# Patient Record
Sex: Female | Born: 1945 | Race: White | Hispanic: No | Marital: Single | Smoking: Never smoker
Health system: Southern US, Community
[De-identification: ages and names within clinical notes are randomized; demographics above are authoritative.]

## PROBLEM LIST (undated history)

## (undated) DIAGNOSIS — M1990 Unspecified osteoarthritis, unspecified site: Secondary | ICD-10-CM

## (undated) DIAGNOSIS — Z853 Personal history of malignant neoplasm of breast: Secondary | ICD-10-CM

## (undated) DIAGNOSIS — Z803 Family history of malignant neoplasm of breast: Secondary | ICD-10-CM

## (undated) DIAGNOSIS — E785 Hyperlipidemia, unspecified: Secondary | ICD-10-CM

## (undated) DIAGNOSIS — K219 Gastro-esophageal reflux disease without esophagitis: Secondary | ICD-10-CM

## (undated) DIAGNOSIS — Z8639 Personal history of other endocrine, nutritional and metabolic disease: Secondary | ICD-10-CM

## (undated) DIAGNOSIS — Z1379 Encounter for other screening for genetic and chromosomal anomalies: Secondary | ICD-10-CM

## (undated) HISTORY — DX: Family history of malignant neoplasm of breast: Z80.3

## (undated) HISTORY — DX: Unspecified osteoarthritis, unspecified site: M19.90

## (undated) HISTORY — PX: BREAST SURGERY: SHX581

## (undated) HISTORY — DX: Encounter for other screening for genetic and chromosomal anomalies: Z13.79

## (undated) HISTORY — DX: Personal history of other endocrine, nutritional and metabolic disease: Z86.39

## (undated) HISTORY — DX: Personal history of malignant neoplasm of breast: Z85.3

## (undated) HISTORY — PX: EYE SURGERY: SHX253

## (undated) HISTORY — PX: TONSILLECTOMY: SUR1361

## (undated) HISTORY — DX: Hyperlipidemia, unspecified: E78.5

---

## 1975-01-13 HISTORY — PX: LIVER SURGERY: SHX698

## 1975-01-13 HISTORY — PX: ABDOMINAL EXPLORATION SURGERY: SHX538

## 2006-01-12 HISTORY — PX: MASTECTOMY: SHX3

## 2010-11-04 ENCOUNTER — Institutional Professional Consult (permissible substitution): Payer: Self-pay | Admitting: Internal Medicine

## 2010-11-10 ENCOUNTER — Institutional Professional Consult (permissible substitution): Payer: Self-pay | Admitting: Internal Medicine

## 2010-11-13 ENCOUNTER — Encounter: Payer: Self-pay | Admitting: *Deleted

## 2010-11-14 ENCOUNTER — Encounter: Payer: Self-pay | Admitting: Internal Medicine

## 2010-11-14 ENCOUNTER — Ambulatory Visit (INDEPENDENT_AMBULATORY_CARE_PROVIDER_SITE_OTHER)
Admission: RE | Admit: 2010-11-14 | Discharge: 2010-11-14 | Disposition: A | Discharge status: Home / Self Care | Payer: BC Managed Care – PPO | Source: Ambulatory Visit | Attending: Internal Medicine | Admitting: Internal Medicine

## 2010-11-14 ENCOUNTER — Telehealth: Payer: Self-pay | Admitting: Internal Medicine

## 2010-11-14 ENCOUNTER — Ambulatory Visit (INDEPENDENT_AMBULATORY_CARE_PROVIDER_SITE_OTHER): Payer: BC Managed Care – PPO | Admitting: Internal Medicine

## 2010-11-14 VITALS — BP 110/70 | HR 93 | Temp 97.4°F | Ht 60.0 in | Wt 167.6 lb

## 2010-11-14 DIAGNOSIS — R0609 Other forms of dyspnea: Secondary | ICD-10-CM

## 2010-11-14 DIAGNOSIS — R0989 Other specified symptoms and signs involving the circulatory and respiratory systems: Secondary | ICD-10-CM

## 2010-11-14 DIAGNOSIS — R0600 Dyspnea, unspecified: Secondary | ICD-10-CM | POA: Insufficient documentation

## 2010-11-14 MED ORDER — BUDESONIDE-FORMOTEROL FUMARATE 160-4.5 MCG/ACT IN AERO: INHALATION_SPRAY | RESPIRATORY_TRACT | Status: DC

## 2010-11-14 NOTE — Progress Notes (Signed)
  Subjective:    Patient ID: Kara [name], female    DOB: 31-Dec-1945, 65 y.o.   MRN: [id number]  HPI  45 yowf never smoker with ? Cat allergy manifested as trouble breathing starting as teenager referred by Dr Cyndia Bent for evaluation of sob 11/14/2010  11/14/2010  Dylan Monforte  Initial pulmonary office eval . Cc  Sob variably at rest x 2 years episodic lasts a couple of days, goes away for months ? Maybe advair helped maybe not because got better in Sept s [place]air [place], no assoc with activity which was not limited by sob even when she had it rest.   Sleeping ok without nocturnal  or early am exacerbation  of respiratory  c/o's or need for noct saba. Also denies any obvious fluctuation of symptoms with weather or environmental changes or other aggravating or alleviating factors except as outlined above        Review of Systems  Constitutional: Negative for fever and unexpected weight change.  HENT: Negative for ear pain, nosebleeds, congestion, sore throat, rhinorrhea, sneezing, trouble swallowing, dental problem, postnasal drip and sinus pressure.   Eyes: Negative for redness and itching.  Respiratory: Positive for shortness of breath. Negative for cough, chest tightness and wheezing.   Cardiovascular: Negative for palpitations and leg swelling.  Gastrointestinal: Negative for nausea and vomiting.  Genitourinary: Negative for dysuria.  Musculoskeletal: Negative for joint swelling.  Skin: Negative for rash.  Neurological: Negative for headaches.  Hematological: Does not bruise/bleed easily.  Psychiatric/Behavioral: Negative for dysphoric mood. The patient is not nervous/anxious.        Objective:   Physical Exam  Wt 167 11/14/2010   HEENT: nl dentition, turbinates, and orophanx. Nl external ear canals without cough reflex   NECK :  without JVD/Nodes/TM/ nl carotid upstrokes bilaterally   LUNGS: no acc muscle use, clear to A and P bilaterally without cough on  insp or exp maneuvers   CV:  RRR  no s3 or murmur or increase in P2, no edema   ABD:  soft and nontender with nl excursion in the supine position. No bruits or organomegaly, bowel sounds nl  MS:  warm without deformities, calf tenderness, cyanosis or clubbing  SKIN: warm and dry without lesions    NEURO:  alert, approp, no deficits   cxr 11/14/10 No active cardiopulmonary abnormalities.      Assessment & Plan:

## 2010-11-14 NOTE — Patient Instructions (Addendum)
GERD (REFLUX)  is an extremely common cause of respiratory symptoms, many times with no significant heartburn at all.    It can be treated with medication, but also with lifestyle changes including avoidance of late meals, excessive alcohol, smoking cessation, and avoid fatty foods, chocolate, peppermint, colas, red wine, and acidic juices such as orange juice.  NO MINT OR MENTHOL PRODUCTS SO NO COUGH DROPS  USE SUGARLESS CANDY INSTEAD (jolley ranchers or Stover's)  NO OIL BASED VITAMINS - use powdered substitutes.   If you have an attack try symicort 160 2 puffs every 12 hours - this should eliminite a spell within 15 minutes - please call us to let us know how you respond because if there's any doubt about the diagnosis is a methacholine challenge test.  Work on inhaler technique:  relax and gently blow all the way out then take a nice smooth deep breath back in, triggering the inhaler at same time you start breathing in.  Hold for up to 5 seconds if you can.  Rinse and gargle with water when done   If your mouth or throat starts to bother you,   I suggest you time the inhaler to your dental care and after using the inhaler(s) brush teeth and tongue with a baking soda containing toothpaste and when you rinse this out, gargle with it first to see if this helps your mouth and throat.     Please remember to go to the lab and x-ray department downstairs for your tests - we will call you with the results when then are available.

## 2010-11-15 NOTE — Assessment & Plan Note (Signed)
Symptoms are markedly disproportionate to objective findings and not clear this is a lung problem but pt does appear to have difficult airway management issues.   DDX of  difficult airways managment all start with A and  include Adherence, Ace Inhibitors, Acid Reflux, Active Sinus Disease, Alpha 1 Antitripsin deficiency, Anxiety masquerading as Airways dz,  ABPA,  allergy(esp in young), Aspiration (esp in elderly), Adverse effects of DPI,  Active smokers, plus two Bs  = Bronchiectasis and Beta blocker use..and one C= CHF  Adherence is always the initial "prime suspect" and is a multilayered concern that requires a "trust but verify" approach in every patient - starting with knowing how to use medications, especially inhalers, correctly, keeping up with refills and understanding the fundamental difference between maintenance and prns vs those medications only taken for a very short course and then stopped and not refilled. The proper method of use, as well as anticipated side effects, of this metered-dose inhaler are discussed and demonstrated to the patient. Improved only to 50% with coaching but worth empirically trying symicort with next flare and go on to MCT next if still having intermittent sob at rest with nl pft's  ? Acid reflux always a concern in this setting >  See instructions for specific recommendations which were reviewed directly with the patient who was given a copy with highlighter outlining the key components.

## 2010-11-17 NOTE — Telephone Encounter (Signed)
Pt stated she is returning MW's call & can be reached at [phone].  This note was created by MW on 11/2  Antionette Fairy

## 2010-11-17 NOTE — Telephone Encounter (Signed)
Pt notified that cxr showed no acute changes per Dr. Sherene Sires and verbalized understanding.

## 2011-09-15 ENCOUNTER — Emergency Department (HOSPITAL_COMMUNITY): Payer: BC Managed Care – PPO | Admitting: Anesthesiology

## 2011-09-15 ENCOUNTER — Inpatient Hospital Stay (HOSPITAL_COMMUNITY)
Admission: EM | Admit: 2011-09-15 | Discharge: 2011-09-21 | DRG: 165 | Disposition: A | Discharge status: Home / Self Care | Payer: BC Managed Care – PPO | Attending: General Surgery | Admitting: General Surgery

## 2011-09-15 ENCOUNTER — Encounter (HOSPITAL_COMMUNITY): Admission: EM | Disposition: A | Discharge status: Home / Self Care | Payer: Self-pay | Source: Home / Self Care

## 2011-09-15 ENCOUNTER — Emergency Department (HOSPITAL_COMMUNITY): Payer: BC Managed Care – PPO

## 2011-09-15 ENCOUNTER — Encounter (HOSPITAL_COMMUNITY): Payer: Self-pay | Admitting: Anesthesiology

## 2011-09-15 ENCOUNTER — Encounter (HOSPITAL_COMMUNITY): Payer: Self-pay

## 2011-09-15 DIAGNOSIS — E669 Obesity, unspecified: Secondary | ICD-10-CM | POA: Diagnosis present

## 2011-09-15 DIAGNOSIS — R109 Unspecified abdominal pain: Secondary | ICD-10-CM

## 2011-09-15 DIAGNOSIS — R112 Nausea with vomiting, unspecified: Secondary | ICD-10-CM

## 2011-09-15 DIAGNOSIS — R51 Headache: Secondary | ICD-10-CM | POA: Diagnosis present

## 2011-09-15 DIAGNOSIS — E785 Hyperlipidemia, unspecified: Secondary | ICD-10-CM | POA: Diagnosis present

## 2011-09-15 DIAGNOSIS — K3533 Acute appendicitis with perforation and localized peritonitis, with abscess: Principal | ICD-10-CM | POA: Diagnosis present

## 2011-09-15 DIAGNOSIS — K219 Gastro-esophageal reflux disease without esophagitis: Secondary | ICD-10-CM | POA: Diagnosis present

## 2011-09-15 DIAGNOSIS — Z853 Personal history of malignant neoplasm of breast: Secondary | ICD-10-CM | POA: Diagnosis present

## 2011-09-15 DIAGNOSIS — K3580 Unspecified acute appendicitis: Secondary | ICD-10-CM

## 2011-09-15 DIAGNOSIS — R1031 Right lower quadrant pain: Secondary | ICD-10-CM

## 2011-09-15 HISTORY — PX: APPENDECTOMY: SHX54

## 2011-09-15 HISTORY — DX: Gastro-esophageal reflux disease without esophagitis: K21.9

## 2011-09-15 HISTORY — DX: Hyperlipidemia, unspecified: E78.5

## 2011-09-15 LAB — COMPREHENSIVE METABOLIC PANEL
ALT: 24 U/L (ref 0–35)
AST: 24 U/L (ref 0–37)
Albumin: 3.3 g/dL — ABNORMAL LOW (ref 3.5–5.2)
Alkaline Phosphatase: 136 U/L — ABNORMAL HIGH (ref 39–117)
BUN: 15 mg/dL (ref 6–23)
CO2: 27 mEq/L (ref 19–32)
Calcium: 8.5 mg/dL (ref 8.4–10.5)
Chloride: 96 mEq/L (ref 96–112)
Creatinine, Ser: 0.64 mg/dL (ref 0.50–1.10)
GFR calc Af Amer: >90 mL/min (ref >90)
GFR calc non Af Amer: >90 mL/min (ref >90)
Glucose, Bld: 98 mg/dL (ref 70–99)
Potassium: 3.4 mEq/L — ABNORMAL LOW (ref 3.5–5.1)
Sodium: 136 mEq/L (ref 135–145)
Total Bilirubin: 0.5 mg/dL (ref 0.3–1.2)
Total Protein: 6.7 g/dL (ref 6.0–8.3)

## 2011-09-15 LAB — CBC
HCT: 41.4 % (ref 36.0–46.0)
Hemoglobin: 14.3 g/dL (ref 12.0–15.0)
MCH: 30 pg (ref 26.0–34.0)
MCHC: 34.5 g/dL (ref 30.0–36.0)
MCV: 87 fL (ref 78.0–100.0)
Platelets: 191 10*3/uL (ref 150–400)
RBC: 4.76 MIL/uL (ref 3.87–5.11)
RDW: 13 % (ref 11.5–15.5)
WBC: 16.2 10*3/uL — ABNORMAL HIGH (ref 4.0–10.5)

## 2011-09-15 LAB — LIPASE, BLOOD: Lipase: 58 U/L (ref 11–59)

## 2011-09-15 LAB — PROTIME-INR: INR: 1.17 (ref 0.00–1.49)

## 2011-09-15 SURGERY — APPENDECTOMY
Anesthesia: General | Site: Abdomen | Laterality: Right | Wound class: Dirty or Infected

## 2011-09-15 MED ORDER — ONDANSETRON HCL 4 MG/2ML IJ SOLN: 4.0000 mg | Freq: Four times a day (QID) | INTRAMUSCULAR | Status: DC | PRN

## 2011-09-15 MED ORDER — LIDOCAINE HCL (CARDIAC) 20 MG/ML IV SOLN
INTRAVENOUS | Status: DC | PRN
  Administered 2011-09-15: 100 mg via INTRAVENOUS

## 2011-09-15 MED ORDER — ONDANSETRON HCL 4 MG PO TABS: 4.0000 mg | ORAL_TABLET | Freq: Four times a day (QID) | ORAL | Status: DC | PRN

## 2011-09-15 MED ORDER — GLYCOPYRROLATE 0.2 MG/ML IJ SOLN
INTRAMUSCULAR | Status: DC | PRN
  Administered 2011-09-15: .6 mg via INTRAVENOUS

## 2011-09-15 MED ORDER — MORPHINE SULFATE 2 MG/ML IJ SOLN
2.0000 mg | INTRAMUSCULAR | Status: DC | PRN
  Administered 2011-09-15: 2 mg via INTRAVENOUS
  Administered 2011-09-16 (×3): 4 mg via INTRAVENOUS
  Administered 2011-09-16: 2 mg via INTRAVENOUS
  Administered 2011-09-16 – 2011-09-17 (×4): 4 mg via INTRAVENOUS
  Administered 2011-09-18 – 2011-09-19 (×9): 2 mg via INTRAVENOUS
  Filled 2011-09-15: qty 1
  Filled 2011-09-15 (×2): qty 2
  Filled 2011-09-15: qty 1
  Filled 2011-09-15 (×2): qty 2
  Filled 2011-09-15: qty 1
  Filled 2011-09-15: qty 2
  Filled 2011-09-15 (×3): qty 1
  Filled 2011-09-15: qty 2
  Filled 2011-09-15: qty 1
  Filled 2011-09-15 (×2): qty 2
  Filled 2011-09-15 (×3): qty 1

## 2011-09-15 MED ORDER — SUCCINYLCHOLINE CHLORIDE 20 MG/ML IJ SOLN
INTRAMUSCULAR | Status: DC | PRN
  Administered 2011-09-15: 100 mg via INTRAVENOUS

## 2011-09-15 MED ORDER — MIDAZOLAM HCL 5 MG/5ML IJ SOLN
INTRAMUSCULAR | Status: DC | PRN
  Administered 2011-09-15: 2 mg via INTRAVENOUS

## 2011-09-15 MED ORDER — ROCURONIUM BROMIDE 100 MG/10ML IV SOLN
INTRAVENOUS | Status: DC | PRN
  Administered 2011-09-15: 30 mg via INTRAVENOUS

## 2011-09-15 MED ORDER — ONDANSETRON HCL 4 MG/2ML IJ SOLN
INTRAMUSCULAR | Status: DC | PRN
  Administered 2011-09-15: 4 mg via INTRAVENOUS

## 2011-09-15 MED ORDER — OXYCODONE HCL 5 MG/5ML PO SOLN
5.0000 mg | Freq: Once | ORAL | Status: DC | PRN
  Filled 2011-09-15: qty 5

## 2011-09-15 MED ORDER — DEXAMETHASONE SODIUM PHOSPHATE 10 MG/ML IJ SOLN
INTRAMUSCULAR | Status: DC | PRN
  Administered 2011-09-15: 10 mg via INTRAVENOUS

## 2011-09-15 MED ORDER — FENTANYL CITRATE 0.05 MG/ML IJ SOLN
INTRAMUSCULAR | Status: DC | PRN
  Administered 2011-09-15: 100 ug via INTRAVENOUS
  Administered 2011-09-15: 50 ug via INTRAVENOUS
  Administered 2011-09-15: 100 ug via INTRAVENOUS

## 2011-09-15 MED ORDER — SODIUM CHLORIDE 0.9 % IV SOLN
1.0000 g | INTRAVENOUS | Status: DC
  Administered 2011-09-16 – 2011-09-20 (×5): 1 g via INTRAVENOUS
  Filled 2011-09-15 (×6): qty 1

## 2011-09-15 MED ORDER — LACTATED RINGERS IV SOLN
INTRAVENOUS | Status: DC | PRN
  Administered 2011-09-15 (×2): via INTRAVENOUS

## 2011-09-15 MED ORDER — ONDANSETRON HCL 4 MG/2ML IJ SOLN
4.0000 mg | Freq: Once | INTRAMUSCULAR | Status: DC
  Filled 2011-09-15: qty 2

## 2011-09-15 MED ORDER — SODIUM CHLORIDE 0.9 % IV SOLN
1.0000 g | Freq: Once | INTRAVENOUS | Status: AC
  Administered 2011-09-15: 1 g via INTRAVENOUS
  Filled 2011-09-15: qty 1

## 2011-09-15 MED ORDER — KCL IN DEXTROSE-NACL 20-5-0.9 MEQ/L-%-% IV SOLN
INTRAVENOUS | Status: DC
  Administered 2011-09-15 – 2011-09-18 (×7): via INTRAVENOUS
  Administered 2011-09-19: 50 mL/h via INTRAVENOUS
  Filled 2011-09-15 (×14): qty 1000

## 2011-09-15 MED ORDER — SODIUM CHLORIDE 0.9 % IJ SOLN
INTRAMUSCULAR | Status: DC | PRN
  Administered 2011-09-15: 20 mL

## 2011-09-15 MED ORDER — METOCLOPRAMIDE HCL 5 MG/ML IJ SOLN: 5.0000 mg | INTRAMUSCULAR | Status: DC | PRN

## 2011-09-15 MED ORDER — ACETAMINOPHEN 10 MG/ML IV SOLN
INTRAVENOUS | Status: AC
  Filled 2011-09-15: qty 100

## 2011-09-15 MED ORDER — HYDROMORPHONE HCL PF 1 MG/ML IJ SOLN
INTRAMUSCULAR | Status: DC | PRN
  Administered 2011-09-15 (×2): 1 mg via INTRAVENOUS

## 2011-09-15 MED ORDER — SODIUM CHLORIDE 0.9 % IV BOLUS (SEPSIS)
1000.0000 mL | Freq: Once | INTRAVENOUS | Status: AC
  Administered 2011-09-15: 1000 mL via INTRAVENOUS

## 2011-09-15 MED ORDER — NEOSTIGMINE METHYLSULFATE 1 MG/ML IJ SOLN
INTRAMUSCULAR | Status: DC | PRN
  Administered 2011-09-15: 5 mg via INTRAVENOUS

## 2011-09-15 MED ORDER — LACTATED RINGERS IV SOLN: INTRAVENOUS | Status: DC

## 2011-09-15 MED ORDER — BUPIVACAINE-EPINEPHRINE 0.25% -1:200000 IJ SOLN
INTRAMUSCULAR | Status: AC
  Filled 2011-09-15: qty 1

## 2011-09-15 MED ORDER — ACETAMINOPHEN 10 MG/ML IV SOLN
INTRAVENOUS | Status: DC | PRN
  Administered 2011-09-15: 1000 mg via INTRAVENOUS

## 2011-09-15 MED ORDER — METOCLOPRAMIDE HCL 5 MG/ML IJ SOLN
INTRAMUSCULAR | Status: AC
  Administered 2011-09-15: 5 mg
  Filled 2011-09-15: qty 2

## 2011-09-15 MED ORDER — PROMETHAZINE HCL 25 MG/ML IJ SOLN
INTRAMUSCULAR | Status: AC
  Filled 2011-09-15: qty 1

## 2011-09-15 MED ORDER — CITRIC ACID-SODIUM CITRATE 334-500 MG/5ML PO SOLN
ORAL | Status: AC
  Filled 2011-09-15: qty 30

## 2011-09-15 MED ORDER — OXYCODONE HCL 5 MG PO TABS: 5.0000 mg | ORAL_TABLET | Freq: Once | ORAL | Status: DC | PRN

## 2011-09-15 MED ORDER — CITRIC ACID-SODIUM CITRATE 334-500 MG/5ML PO SOLN
ORAL | Status: DC | PRN
  Administered 2011-09-15: 15 mL via ORAL

## 2011-09-15 MED ORDER — HEPARIN SODIUM (PORCINE) 5000 UNIT/ML IJ SOLN
5000.0000 [IU] | Freq: Three times a day (TID) | INTRAMUSCULAR | Status: DC
  Administered 2011-09-16 – 2011-09-21 (×16): 5000 [IU] via SUBCUTANEOUS
  Filled 2011-09-15 (×19): qty 1

## 2011-09-15 MED ORDER — HYDROMORPHONE HCL PF 1 MG/ML IJ SOLN: 0.2500 mg | INTRAMUSCULAR | Status: DC | PRN

## 2011-09-15 MED ORDER — IOHEXOL 300 MG/ML  SOLN
100.0000 mL | Freq: Once | INTRAMUSCULAR | Status: AC | PRN
  Administered 2011-09-15: 100 mL via INTRAVENOUS

## 2011-09-15 MED ORDER — 0.9 % SODIUM CHLORIDE (POUR BTL) OPTIME
TOPICAL | Status: DC | PRN
  Administered 2011-09-15: 2000 mL

## 2011-09-15 MED ORDER — MEPERIDINE HCL 50 MG/ML IJ SOLN: 6.2500 mg | INTRAMUSCULAR | Status: DC | PRN

## 2011-09-15 MED ORDER — BUPIVACAINE LIPOSOME 1.3 % IJ SUSP
20.0000 mL | Freq: Once | INTRAMUSCULAR | Status: DC
  Filled 2011-09-15: qty 20

## 2011-09-15 MED ORDER — BUPIVACAINE LIPOSOME 1.3 % IJ SUSP
INTRAMUSCULAR | Status: DC | PRN
  Administered 2011-09-15: 20 mL

## 2011-09-15 MED ORDER — PROPOFOL 10 MG/ML IV EMUL
INTRAVENOUS | Status: DC | PRN
  Administered 2011-09-15: 150 mg via INTRAVENOUS

## 2011-09-15 MED ORDER — PROMETHAZINE HCL 25 MG/ML IJ SOLN
6.2500 mg | INTRAMUSCULAR | Status: AC | PRN
  Administered 2011-09-15 (×2): 6.25 mg via INTRAVENOUS

## 2011-09-15 SURGICAL SUPPLY — 40 items
CLOTH BEACON ORANGE TIMEOUT ST (SAFETY) ×2 IMPLANT
DECANTER SPIKE VIAL GLASS SM (MISCELLANEOUS) ×2 IMPLANT
DRAIN CHANNEL 19F RND (DRAIN) ×2 IMPLANT
DRAPE LAPAROTOMY TRNSV 102X78 (DRAPE) IMPLANT
DRAPE UTILITY 15X26 (DRAPE) ×2 IMPLANT
ELECT REM PT RETURN 9FT ADLT (ELECTROSURGICAL) ×2
ELECTRODE REM PT RTRN 9FT ADLT (ELECTROSURGICAL) ×1 IMPLANT
EVACUATOR DRAINAGE 10X20 100CC (DRAIN) ×1 IMPLANT
EVACUATOR SILICONE 100CC (DRAIN) ×1
GLOVE BIOGEL PI IND STRL 7.0 (GLOVE) ×1 IMPLANT
GLOVE BIOGEL PI IND STRL 8.5 (GLOVE) ×2 IMPLANT
GLOVE BIOGEL PI INDICATOR 7.0 (GLOVE) ×1
GLOVE BIOGEL PI INDICATOR 8.5 (GLOVE) ×2
GLOVE SURG SS PI 6.5 STRL IVOR (GLOVE) ×2 IMPLANT
GOWN STRL NON-REIN LRG LVL3 (GOWN DISPOSABLE) IMPLANT
GOWN STRL REIN XL XLG (GOWN DISPOSABLE) ×6 IMPLANT
KIT BASIN OR (CUSTOM PROCEDURE TRAY) ×2 IMPLANT
NEEDLE HYPO 22GX1.5 SAFETY (NEEDLE) ×2 IMPLANT
NS IRRIG 1000ML POUR BTL (IV SOLUTION) ×4 IMPLANT
PACK GENERAL/GYN (CUSTOM PROCEDURE TRAY) ×2 IMPLANT
PEN SKIN MARKING BROAD (MISCELLANEOUS) IMPLANT
RELOAD PROXIMATE 30MM BLUE (ENDOMECHANICALS) ×4 IMPLANT
RELOAD STAPLER LINEAR PROX 30 (STAPLE) ×1 IMPLANT
SPONGE DRAIN TRACH 4X4 STRL 2S (GAUZE/BANDAGES/DRESSINGS) ×2 IMPLANT
SPONGE GAUZE 4X4 12PLY (GAUZE/BANDAGES/DRESSINGS) ×2 IMPLANT
SPONGE LAP 4X18 X RAY DECT (DISPOSABLE) ×2 IMPLANT
STAPLER RELOAD LINEAR PROX 30 (STAPLE) ×2
STAPLER VISISTAT 35W (STAPLE) IMPLANT
SUCTION POOLE TIP (SUCTIONS) ×2 IMPLANT
SUT PDS AB 1 CT1 27 (SUTURE) ×8 IMPLANT
SUT VIC AB 3-0 54XBRD REEL (SUTURE) ×1 IMPLANT
SUT VIC AB 3-0 BRD 54 (SUTURE) ×1
SUT VIC AB 3-0 SH 8-18 (SUTURE) ×2 IMPLANT
SYR 20CC LL (SYRINGE) ×2 IMPLANT
SYR CONTROL 10ML LL (SYRINGE) ×2 IMPLANT
TAPE CLOTH SURG 4X10 WHT LF (GAUZE/BANDAGES/DRESSINGS) ×2 IMPLANT
TOWEL OR 17X26 10 PK STRL BLUE (TOWEL DISPOSABLE) ×4 IMPLANT
TRAY FOLEY CATH 14FRSI W/METER (CATHETERS) ×2 IMPLANT
TUBE ANAEROBIC SPECIMEN COL (MISCELLANEOUS) ×4 IMPLANT
YANKAUER SUCT BULB TIP 10FT TU (MISCELLANEOUS) ×2 IMPLANT

## 2011-09-15 NOTE — ED Notes (Signed)
Surgeon at bedside.  

## 2011-09-15 NOTE — Progress Notes (Signed)
Patient ID: Kara [name], female   DOB: May 27, 1945, 66 y.o.   MRN: [id number]  CT scan has returned showing evidence of acute appendicitis with perforation and adjacent abscess.  I discussed this finding with the patient. Examination shows localized tenderness and some fullness in the right lower quadrant. I recommended proceeding with emergency appendectomy. Due to multiple previous laparotomies with a long midline incision and due to perforation with evidence of significant inflammation of the surrounding viscera I recommended open appendectomy. We discussed the indications for the procedure, alternatives of management with antibiotics, risks of bleeding, infection, anesthetic risks, and possible need for more extensive surgery such as ileocecectomy. With ongoing pain and elevated white count and tenderness I believe proceeding with surgery is the safest course. She understands and agrees and all her questions were answered.  Mariella Saa MD, FACS  09/15/2011, 7:04 PM

## 2011-09-15 NOTE — Anesthesia Preprocedure Evaluation (Addendum)
Anesthesia Evaluation  Patient identified by MRN, date of birth, ID band Patient awake    Reviewed: Allergy & Precautions, H&P , NPO status , Patient's Chart, lab work & pertinent test results  History of Anesthesia Complications (+) DIFFICULT AIRWAY  Airway Mallampati: II TM Distance: >3 FB Neck ROM: Full    Dental  (+) Teeth Intact and Caps   Pulmonary  breath sounds clear to auscultation  Pulmonary exam normal       Cardiovascular Exercise Tolerance: Good Rhythm:Regular Rate:Normal     Neuro/Psych  Headaches,    GI/Hepatic Neg liver ROS, GERD-  ,  Endo/Other  Hyperthyroidism   Renal/GU negative Renal ROS     Musculoskeletal  (+) Arthritis -, Osteoarthritis,    Abdominal (+) + obese,  Abdomen: tender.    Peds  Hematology negative hematology ROS (+)   Anesthesia Other Findings   Reproductive/Obstetrics                          Anesthesia Physical Anesthesia Plan  ASA: II and Emergent  Anesthesia Plan: General   Post-op Pain Management:    Induction: Intravenous, Rapid sequence and Cricoid pressure planned  Airway Management Planned: Oral ETT  Additional Equipment:   Intra-op Plan:   Post-operative Plan: Extubation in OR  Informed Consent: I have reviewed the patients History and Physical, chart, labs and discussed the procedure including the risks, benefits and alternatives for the proposed anesthesia with the patient or authorized representative who has indicated his/her understanding and acceptance.   Dental advisory given  Plan Discussed with: CRNA and Surgeon  Anesthesia Plan Comments:         Anesthesia Quick Evaluation

## 2011-09-15 NOTE — Anesthesia Postprocedure Evaluation (Signed)
Anesthesia Post Note  Patient: Kara [name]. [name]  Procedure(s) Performed: Procedure(s) (LRB): APPENDECTOMY (Right)  Anesthesia type: General  Patient location: PACU  Post pain: Pain level controlled  Post assessment: Post-op Vital signs reviewed  Last Vitals: BP 147/72  Pulse 82  Temp 37.1 C (Oral)  Resp 18  SpO2 100%  Post vital signs: Reviewed  Level of consciousness: sedated  Complications: No apparent anesthesia complications

## 2011-09-15 NOTE — ED Notes (Signed)
Consent signed for OR Appendectomy. Pt waiting to go to OR. No c/o at this time.

## 2011-09-15 NOTE — ED Notes (Signed)
Pt reports pain to RLQ of abdomen, more with movement. Reports N/V on Saturday.  Denies nausea or pain at this time.

## 2011-09-15 NOTE — Transfer of Care (Signed)
Immediate Anesthesia Transfer of Care Note  Patient: Kara [name]  Procedure(s) Performed: Procedure(s) (LRB) with comments: APPENDECTOMY (Right)  Patient Location: PACU  Anesthesia Type: General  Level of Consciousness: awake, alert  and oriented  Airway & Oxygen Therapy: Patient Spontanous Breathing and Patient connected to face mask oxygen  Post-op Assessment: Report given to PACU RN and Post -op Vital signs reviewed and stable  Post vital signs: Reviewed and stable  Complications: No apparent anesthesia complications

## 2011-09-15 NOTE — ED Notes (Signed)
MD at bedside.  Dr. Wilson at bedside 

## 2011-09-15 NOTE — ED Notes (Signed)
Patient transported to CT 

## 2011-09-15 NOTE — ED Notes (Signed)
States that she had been having a lot of cramping and pain for 4 days and went to the MD. States that she had a CT today and was told by Rosey Bath FNP at Dr Fortunato Curling office [facility] told her that a Careers adviser would meet her here in the ED. No pain or nausea at present.
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
## 2011-09-15 NOTE — Preoperative (Signed)
Beta Blockers   Reason not to administer Beta Blockers:Not Applicable 
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
## 2011-09-15 NOTE — Op Note (Signed)
Preoperative Diagnosis: Perforated appendicitis  Postoprative Diagnosis:Same  Procedure: Procedure(s): APPENDECTOMY, OPEN   Surgeon: Glenna Fellows T   Assistants: None  Anesthesia:  General endotracheal anesthesiaDiagnos  Indications:   Patient is a 66 year old female who presents with 3-4 days of persistent right lower quadrant abdominal pain. CT scan this evening has revealed evidence of perforated appendicitis. I recommended proceeding with open appendectomy. The procedure and risks were discussed with the patient detailed elsewhere.  Procedure Detail:  Patient is brought to the operating room, placed in the supine position on the operating table, and general endotracheal anesthesia induced. She had a history of difficult intubation but there were no apparent difficulties per anesthesia. Foley catheter was placed. She was already on broad-spectrum IV antibiotics. The abdomen was widely sterilely prepped and draped. The patient time out was performed and correct procedure verified. An oblique incision was made in the right lower quadrant near McBurney's point and dissection was carried down through the subcutaneous tissue with cautery. The external oblique was divided along its fibers and this was extended over onto the anterior rectus sheath a short distance. A small area of the lateral rectus muscle was divided with cautery. The internal oblique was bluntly split. The posterior sheath and peritoneum were elevated and sharply divided. There was foul-smelling frank purulence in the right lower quadrant that was cultured and suctioned and removed. Was an obvious inflammatory mass just inferior to the cecum. This was exposed and the appendix was seen to be gangrenous. Using blunt dissection it was mobilized away from inflammatory attachments to the right tube and posterior abdomen and elevated and freed back to the tip of the cecum. There was some moderate inflammation of the cecum but the  appendix itself at its base appeared just superficially inflamed. The mesoappendix was separated from the appendix at its space and in the appendix and the mesial appendix were each divided with a single firing of the TA 30 blue load stapler and the specimen was removed. The mesial appendix was further suture ligated with a figure-of-eight suture of 3-0 Vicryl. The appendiceal stump was oversewn with interrupted 3-0 Vicryl. Gloves and instruments were changed and the right lower quadrant was thoroughly irrigated with saline until clear. The lower pelvis was free of purulence. There were adhesions further superiorly along the right colon and the possible inflammatory process appeared localized in the right lower quadrant. After irrigation was clear that his return to their anatomic position. A 19 Blake drain was placed in the area of the appendiceal bed and brought out through a lateral stab wound. The layers of abdominal wall were infiltrated with Exparel local anesthetic. The wound was then closed in 3 layers with running #1 PDS. The subcutaneous tissue and skin were left packed open with moist saline gauze. Sponge needle and instrument counts were correct.  Findings: Gangrenous perforated appendicitis  Estimated Blood Loss:  Minimal         Drains: JACKSON-PRATT (JP)  Blood Given: none          Specimens: appendix        Complications:  * No complications entered in OR log *         Disposition: PACU - hemodynamically stable.         Condition: stable  Mariella Saa MD, FACS  09/15/2011, 9:26 PM

## 2011-09-15 NOTE — ED Notes (Signed)
Pt presents with no acute distress.  Pt c/o of nausea and abd pain since last week- Seen at PCP today CT scan showed inflammation- disc with pt- Past ate ENSURE at 11am

## 2011-09-15 NOTE — H&P (Signed)
History as below.  AFVSS Non toxic appearing. Smiling.  cta Reg Soft, nd, large midline incision. TTP RLQ - No RT/peritonitis.  Outside ct reviewed with Dr Lowella Dandy. Inflammatory process in RLQ. Appendix visualized wall looks ok. Fluid-filled air pocket near cecum/appendix/Rt ovary. No gross free air or fluid. 3cm lesion in liver - hypervascular. Ct somewhat with lack of iv/oral contrast.   Diffl: perforated appendicitis with abscess, cecal diveriticulitis with microperf; TOA  Labs pending  Since pt nontoxic appearing, I think repeating her CT with IV, rectal, oral contrast to try to better elucidate the etiology of the inflammatory process would be worthwhile. A tubovarian abscess would be best managed by GYN. If cecal diverticulitis with microperf could possibly be managed nonsurgically. Appendicitis would require surgery. Explained diffl to pt and sister. They are in agreement with plan. Explained that the CT could be non-diagnostic and might still end up in OR for exploration. Will ask Dr Johna Sheriff to follow-up on.  Mary Sella. Andrey Campanile, MD, FACS General, Bariatric, & Minimally Invasive Surgery [facility], [place]

## 2011-09-16 ENCOUNTER — Encounter (HOSPITAL_COMMUNITY): Payer: Self-pay | Admitting: General Surgery

## 2011-09-16 LAB — CBC
HCT: 40.6 % (ref 36.0–46.0)
MCV: 87.7 fL (ref 78.0–100.0)
Platelets: 154 10*3/uL (ref 150–400)
RBC: 4.63 MIL/uL (ref 3.87–5.11)
RDW: 13.3 % (ref 11.5–15.5)
WBC: 12.4 10*3/uL — ABNORMAL HIGH (ref 4.0–10.5)

## 2011-09-16 LAB — BASIC METABOLIC PANEL
CO2: 23 mEq/L (ref 19–32)
Chloride: 101 mEq/L (ref 96–112)
Creatinine, Ser: 0.49 mg/dL — ABNORMAL LOW (ref 0.50–1.10)
GFR calc Af Amer: >90 mL/min (ref >90)
Potassium: 3.9 mEq/L (ref 3.5–5.1)
Sodium: 135 mEq/L (ref 135–145)
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
## 2011-09-16 NOTE — Care Management Note (Signed)
    Page 1 of 1   09/18/2011     3:17:33 PM   CARE MANAGEMENT NOTE 09/18/2011  Patient:  Kara [name], Kara L.   Account Number:  [id number]  Date Initiated:  09/16/2011  Documentation initiated by:  Calel Pisarski  Subjective/Objective Assessment:   66 yo female admitted s/p open appendectomy. PTA lived at home with father,is his primary caregiver.     Action/Plan:   Anticipated DC Date:  09/19/2011   Anticipated DC Plan:  HOME/SELF CARE      DC Planning Services  CM consult      Choice offered to / List presented to:             Status of service:  Completed, signed off Medicare Important Message given?   (If response is "NO", the following Medicare IM given date fields will be blank) Date Medicare IM given:   Date Additional Medicare IM given:    Discharge Disposition:  HOME/SELF CARE  Per UR Regulation:  Reviewed for med. necessity/level of care/duration of stay  If discussed at Long Length of Stay Meetings, dates discussed:    Comments:  09-18-11 Lorenda Ishihara RN CM [phone] Spoke with patient at bedside. States she currently lives with her 25 yo father who was fairly independent up until recently. He has developed more physical limitations and increased dementia. He is currently being cared for by other family members and is on a wait list for ALF placement. Patient verbalizes understanding that she need recover and states she has good family support. Hopes to have her father placed sometime this month. No HH or DME needs anticipated at this time. Will continue to follow.

## 2011-09-16 NOTE — Progress Notes (Signed)
Some burping, no flatus Pain ok  Alert, nad Soft, mild distension, incision RLQ - dressing c/d/i  Ruptured appx s/p open appy  Start w-d dressing tomorrow Cont iv abx. Will need 12-14 days of abx Cont heparin Ice chips  Mary Sella. Andrey Campanile, MD, FACS General, Bariatric, & Minimally Invasive Surgery [facility], [place]

## 2011-09-17 MED ORDER — ACETAMINOPHEN 10 MG/ML IV SOLN
1000.0000 mg | Freq: Four times a day (QID) | INTRAVENOUS | Status: AC
  Administered 2011-09-17 – 2011-09-18 (×4): 1000 mg via INTRAVENOUS
  Filled 2011-09-17 (×5): qty 100

## 2011-09-17 NOTE — Progress Notes (Signed)
As below  Await return of bowel function. Hopefully start some sips of clears tomorrow. Cont abx for [employer]. Andrey Campanile, MD, FACS General, Bariatric, & Minimally Invasive Surgery [facility], [place]
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
## 2011-09-18 LAB — COMPREHENSIVE METABOLIC PANEL
ALT: 18 U/L (ref 0–35)
Alkaline Phosphatase: 78 U/L (ref 39–117)
BUN: 5 mg/dL — ABNORMAL LOW (ref 6–23)
CO2: 23 mEq/L (ref 19–32)
GFR calc Af Amer: >90 mL/min (ref >90)
GFR calc non Af Amer: >90 mL/min (ref >90)
Glucose, Bld: 103 mg/dL — ABNORMAL HIGH (ref 70–99)
Potassium: 3.6 mEq/L (ref 3.5–5.1)
Sodium: 136 mEq/L (ref 135–145)

## 2011-09-18 LAB — CBC
HCT: 33.5 % — ABNORMAL LOW (ref 36.0–46.0)
Hemoglobin: 11.1 g/dL — ABNORMAL LOW (ref 12.0–15.0)
MCH: 29.8 pg (ref 26.0–34.0)
RBC: 3.73 MIL/uL — ABNORMAL LOW (ref 3.87–5.11)

## 2011-09-18 LAB — CULTURE, ROUTINE-ABSCESS

## 2011-09-18 MED ORDER — BISACODYL 10 MG RE SUPP
10.0000 mg | Freq: Two times a day (BID) | RECTAL | Status: AC
  Administered 2011-09-18: 10 mg via RECTAL
  Filled 2011-09-18: qty 1

## 2011-09-18 NOTE — Progress Notes (Signed)
Advance directive information and paperwork placed on chart. Original advanced directive paperwork returned to patient and copy placed to chart.

## 2011-09-18 NOTE — Progress Notes (Signed)
3 Days Post-Op  Subjective:  Alert and stable. Afebrile. Ambulating in halls. Voiding uneventfully. No flatus or stool yet. No real nausea. Eating ice chips.  Pathology report shows acute suppurative appendicitis with rupture.  WBC down to 9100, hemoglobin 11.1, potassium 3.6, glucose 103.  Objective: Vital signs in last 24 hours: Temp:  [97.5 F (36.4 C)-98.8 F (37.1 C)] 98.8 F (37.1 C) (09/05 2137) Pulse Rate:  [80-94] 80  (09/05 2137) Resp:  [18] 18  (09/05 2137) BP: (113-129)/(68-72) 113/68 mmHg (09/05 2137) SpO2:  [95 %-97 %] 96 % (09/05 2137) Last BM Date: 09/15/11  Intake/Output from previous day: 09/05 0701 - 09/06 0700 In: 3235.4 [I.V.:3235.4] Out: 975 [Urine:950; Drains:25] Intake/Output this shift: Total I/O In: 1250 [I.V.:1250] Out: 505 [Urine:500; Drains:5]  General appearance: alert. No distress. Friendly Mental status normal. GI: abdomen soft. Occasional bowel sounds. Wound packed open and very clean. JP drainage thin, serous.  Lab Results:  Results for orders placed during the hospital encounter of 09/15/11 (from the past 24 hour(s))  CBC     Status: Abnormal   Collection Time   09/18/11  4:13 AM      Component Value Range   WBC 9.1  4.0 - 10.5 K/uL   RBC 3.73 (*) 3.87 - 5.11 MIL/uL   Hemoglobin 11.1 (*) 12.0 - 15.0 g/dL   HCT 16.1 (*) 09.6 - 04.5 %   MCV 89.8  78.0 - 100.0 fL   MCH 29.8  26.0 - 34.0 pg   MCHC 33.1  30.0 - 36.0 g/dL   RDW 40.9  81.1 - 91.4 %   Platelets 172  150 - 400 K/uL  COMPREHENSIVE METABOLIC PANEL     Status: Abnormal   Collection Time   09/18/11  4:13 AM      Component Value Range   Sodium 136  135 - 145 mEq/L   Potassium 3.6  3.5 - 5.1 mEq/L   Chloride 104  96 - 112 mEq/L   CO2 23  19 - 32 mEq/L   Glucose, Bld 103 (*) 70 - 99 mg/dL   BUN 5 (*) 6 - 23 mg/dL   Creatinine, Ser 7.82 (*) 0.50 - 1.10 mg/dL   Calcium 7.4 (*) 8.4 - 10.5 mg/dL   Total Protein 4.7 (*) 6.0 - 8.3 g/dL   Albumin 2.0 (*) 3.5 - 5.2 g/dL   AST 17  0  - 37 U/L   ALT 18  0 - 35 U/L   Alkaline Phosphatase 78  39 - 117 U/L   Total Bilirubin 0.3  0.3 - 1.2 mg/dL   GFR calc non Af Amer >90  >90 mL/min   GFR calc Af Amer >90  >90 mL/min     Studies/Results: @RISRSLT24 @     . acetaminophen  1,000 mg Intravenous Q6H  . ertapenem (INVANZ) IV  1 g Intravenous Q24H  . heparin  5,000 Units Subcutaneous Q8H     Assessment/Plan: s/p Procedure(s): APPENDECTOMY  POD #3. Stable. Ruptured appendicitis with localized peritonitis. Await return of bowel function. Allow clear liquids, as tol. Continue antibiotics dulcolax suppository. Pathology report discussed with patient.    LOS: 3 days    Erlean Mealor M. Derrell Lolling, M.D., [facility] General and Minimally invasive Surgery Breast and Colorectal Surgery Office:   [phone] Pager:   [phone]  09/18/2011  . .prob

## 2011-09-19 MED ORDER — HYDROCODONE-ACETAMINOPHEN 5-325 MG PO TABS
1.0000 | ORAL_TABLET | ORAL | Status: DC | PRN
  Administered 2011-09-19 – 2011-09-20 (×6): 1 via ORAL
  Filled 2011-09-19 (×6): qty 1

## 2011-09-19 NOTE — Progress Notes (Signed)
General Surgery Note  LOS: 4 days  POD# 4 Room - 1539  Assessment/Plan: 1.  APPENDECTOMY, open - B. [facility] - 09/15/2011  Drain removed.  Open wound.  Doing well.  To increase to full liq   2.  GERD (gastroesophageal reflux disease) (09/15/2011)    3.  Dyslipidemia  4.  DVT proph - SQ hep    Subjective:  Having loose stools.  Tolerating clear liquids.  Lives by self.  We talked a little about going home.  She has an older father she takes care of.  Objective:   Filed Vitals:   09/19/11 0541  BP: 135/56  Pulse: 85  Temp: 99.4 F (37.4 C)  Resp: 18     Intake/Output from previous day:  09/06 0701 - 09/07 0700 In: 2980 [P.O.:480; I.V.:2500] Out: 2880 [Urine:2850; Drains:30]  Intake/Output this shift:  Total I/O In: -  Out: 600 [Urine:600]   Physical Exam:   General: WN WF who is alert and oriented.    HEENT: Normal. Pupils equal. .   Lungs: Clear   Abdomen: Soft, few BS.   Wound: Open and clean.  I removed the JP drain.   Neurologic:  Grossly intact to motor and sensory function.   Psychiatric: Has normal mood and affect.   Lab Results:    [facility] 09/18/11 0413  WBC 9.1  HGB 11.1*  HCT 33.5*  PLT 172    BMET   Basename 09/18/11 0413  NA 136  K 3.6  CL 104  CO2 23  GLUCOSE 103*  BUN 5*  CREATININE 0.49*  CALCIUM 7.4*    PT/INR  No results found for this basename: LABPROT:2,INR:2 in the last 72 hours  ABG  No results found for this basename: PHART:2,PCO2:2,PO2:2,HCO3:2 in the last 72 hours   Studies/Results:  No results found.   Anti-infectives:   Anti-infectives     Start     Dose/Rate Route Frequency Ordered Stop   09/16/11 1500   ertapenem (INVANZ) 1 g in sodium chloride 0.9 % 50 mL IVPB        1 g 100 mL/hr over 30 Minutes Intravenous Every 24 hours 09/15/11 2323     09/15/11 1515   ertapenem (INVANZ) 1 g in sodium chloride 0.9 % 50 mL IVPB        1 g 100 mL/hr over 30 Minutes Intravenous  Once 09/15/11 1506 09/15/11 1603           Ovidio Kin, MD, FACS Pager: [phone],   [facility] Office: [phone] 09/19/2011

## 2011-09-19 NOTE — Progress Notes (Signed)
Pt alert x4 tol dressing change last night. Bid wet to dry dressing done around 23:30. Pre medicated with pain meds. Wet to dry with packing two 4x4 guaze used. No odor or drainage tol well will continue to monitor.

## 2011-09-20 LAB — ANAEROBIC CULTURE

## 2011-09-20 MED ORDER — LIDOCAINE HCL 1 % IJ SOLN
INTRAMUSCULAR | Status: AC
  Administered 2011-09-20: 10:00:00
  Filled 2011-09-20: qty 20

## 2011-09-20 NOTE — Progress Notes (Addendum)
General Surgery Note  LOS: 5 days  POD# 5 Room - 1539  Assessment/Plan: 1.  APPENDECTOMY, open - B. Hoxworth - 09/15/2011  Open wound, which looks good.  I did a DPC of the wound.  To increase to reg diet  On Invanz  Doing well.  2.  GERD (gastroesophageal reflux disease) (09/15/2011)    3.  Dyslipidemia  4.  DVT proph - SQ hep  Procedure note: At the bedside, I did a DPC of the RLQ wound.  The wound was painted with betadine.  Infiltrated with about 15 cc of 1% xylocaine.  Closed with 2-0 nylon sutures and sterilely dressed.    Subjective:  Had some loose stools.  Tolerating full liquids.  Lives by self.  We talked a little about going home.  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  That is her job.  Objective:   Filed Vitals:   09/20/11 0600  BP: 114/69  Pulse: 79  Temp: 98.4 F (36.9 C)  Resp: 16     Intake/Output from previous day:  09/07 0701 - 09/08 0700 In: 1961.3 [P.O.:360; I.V.:1601.3] Out: 3850 [Urine:3850]  Intake/Output this shift:      Physical Exam:   General: WN WF who is alert and oriented.    HEENT: Normal. Pupils equal.   Abdomen: Soft, few BS.   Wound: Open and clean.    Neurologic:  Grossly intact to motor and sensory function.   Psychiatric: Has normal mood and affect.   Lab Results:     [facility] 09/18/11 0413  WBC 9.1  HGB 11.1*  HCT 33.5*  PLT 172    BMET    Basename 09/18/11 0413  NA 136  K 3.6  CL 104  CO2 23  GLUCOSE 103*  BUN 5*  CREATININE 0.49*  CALCIUM 7.4*    PT/INR  No results found for this basename: LABPROT:2,INR:2 in the last 72 hours  ABG  No results found for this basename: PHART:2,PCO2:2,PO2:2,HCO3:2 in the last 72 hours   Studies/Results:  No results found.   Anti-infectives:   Anti-infectives     Start     Dose/Rate Route Frequency Ordered Stop   09/16/11 1500   ertapenem (INVANZ) 1 g in sodium chloride 0.9 % 50 mL IVPB        1 g 100 mL/hr over 30 Minutes Intravenous Every 24 hours  09/15/11 2323     09/15/11 1515   ertapenem (INVANZ) 1 g in sodium chloride 0.9 % 50 mL IVPB        1 g 100 mL/hr over 30 Minutes Intravenous  Once 09/15/11 1506 09/15/11 1603          Ovidio Kin, MD, FACS Pager: [phone],   [facility] Office: [phone] 09/20/2011

## 2011-09-21 MED ORDER — HYDROCODONE-ACETAMINOPHEN 5-325 MG PO TABS: 1.0000 | ORAL_TABLET | ORAL | Status: DC | PRN

## 2011-09-21 NOTE — Discharge Summary (Signed)
Patient ID: Kara [name] MRN: [id number] DOB/AGE: 64-15-1947 64 y.o.  Admit date: 09/15/2011 Discharge date: 09/21/2011  Procedures: open appendectomy  Consults: None  Reason for Admission: This is a 66 yo female who started having nausea and vomiting with abdominal pain.  She saw her PCP who sent her for a CT scan.  Eventually, this was felt to represent acute appendicitis.  She was admitted.  Admission Diagnoses:  1.  Right lower quadrant intra-abdominal abscess, possible colitis, possible ruptured appendix, possible tubo-ovarian abscess  Hospital Course: The patient was admitted.  Because the first CT was without contrast and a definite process could not be determined, a repeat scan with oral, IV, and rectal contrast was given.  This revealed acute perforated appendicitis with abscess.  She was taken to the operating room where she underwent an open appendectomy.  Her wound was left open.  Delayed primary closure of the wound was ultimately done prior to discharge.  She did have a post op ileus, but was able to start on clear liquids on POD# 3.  She tolerated these well and was able to have her diet advanced as tolerated.  She remained on Invanz throughout her hospitalization.  Her WBC was normal and this was discontinued at time of discharge.  The patient was felt stable for dc home on POD #6 with 4 more days of po abx therapy.  Discharge Diagnoses:  Active Problems:  GERD (gastroesophageal reflux disease)  Dyslipidemia acute perforated appendicitis, s/p open appendectomy  Discharge Medications: Medication List  As of 09/21/2011 11:57 AM   TAKE these medications         CALCIUM PO   Take 1 tablet by mouth daily.      cholecalciferol 1000 UNITS tablet   Commonly known as: VITAMIN D   Take 2,000 Units by mouth daily.      ezetimibe 10 MG tablet   Commonly known as: ZETIA   Take 10 mg by mouth daily.      HYDROcodone-acetaminophen 5-325 MG per tablet   Commonly known as:  NORCO/VICODIN   Take 1-2 tablets by mouth every 4 (four) hours as needed.      PRESCRIPTION MEDICATION   Take 1 tablet by mouth daily.      PreserVision/Lutein Caps   Take 1 capsule by mouth daily.              Augmentin 875mg                    Take 1 tablet by mouth BID for 4 days    Discharge Instructions: Follow-up Information    Follow up with [facility] on 09/29/2011. (arrive at 1:55 for a 2:15pm)          Signed: Deannie Resetar E 09/21/2011, 11:57 AM

## 2011-09-21 NOTE — Progress Notes (Signed)
Agree with above, home today

## 2011-09-21 NOTE — Progress Notes (Signed)
Patient ID: Kara [name], female   DOB: 05/07/1945, 66 y.o.   MRN: [id number] 6 Days Post-Op  Subjective: Pt feels well.  No c/o.  Tolerating a regular diet  Objective: Vital signs in last 24 hours: Temp:  [97.9 F (36.6 C)-98.8 F (37.1 C)] 98 F (36.7 C) (09/09 0515) Pulse Rate:  [81-96] 81  (09/09 0515) Resp:  [16-18] 18  (09/09 0515) BP: (118-130)/(42-75) 122/72 mmHg (09/09 0515) SpO2:  [93 %-95 %] 93 % (09/09 0515) Last BM Date: 09/18/11  Intake/Output from previous day: 09/08 0701 - 09/09 0700 In: 290 [P.O.:240; IV Piggyback:50] Out: 1500 [Urine:1500] Intake/Output this shift: Total I/O In: 240 [P.O.:240] Out: 75 [Urine:75]  PE: Abd: soft, +BS, ND, incision has been closed and looks good.  No erythema or sign of infection.  Lab Results:  No results found for this basename: WBC:2,HGB:2,HCT:2,PLT:2 in the last 72 hours BMET No results found for this basename: NA:2,K:2,CL:2,CO2:2,GLUCOSE:2,BUN:2,CREATININE:2,CALCIUM:2 in the last 72 hours PT/INR No results found for this basename: LABPROT:2,INR:2 in the last 72 hours CMP     Component Value Date/Time   NA 136 09/18/2011 0413   K 3.6 09/18/2011 0413   CL 104 09/18/2011 0413   CO2 23 09/18/2011 0413   GLUCOSE 103* 09/18/2011 0413   BUN 5* 09/18/2011 0413   CREATININE 0.49* 09/18/2011 0413   CALCIUM 7.4* 09/18/2011 0413   PROT 4.7* 09/18/2011 0413   ALBUMIN 2.0* 09/18/2011 0413   AST 17 09/18/2011 0413   ALT 18 09/18/2011 0413   ALKPHOS 78 09/18/2011 0413   BILITOT 0.3 09/18/2011 0413   GFRNONAA >90 09/18/2011 0413   GFRAA >90 09/18/2011 0413   Lipase     Component Value Date/Time   LIPASE 58 09/15/2011 1525       Studies/Results: No results found.  Anti-infectives: Anti-infectives     Start     Dose/Rate Route Frequency Ordered Stop   09/16/11 1500   ertapenem (INVANZ) 1 g in sodium chloride 0.9 % 50 mL IVPB        1 g 100 mL/hr over 30 Minutes Intravenous Every 24 hours 09/15/11 2323     09/15/11 1515   ertapenem (INVANZ)  1 g in sodium chloride 0.9 % 50 mL IVPB        1 g 100 mL/hr over 30 Minutes Intravenous  Once 09/15/11 1506 09/15/11 1603           Assessment/Plan  1. S/p open appy  Plan: 1. Ok to [employer] home today.   LOS: 6 days    Liborio Saccente E 09/21/2011

## 2011-09-22 ENCOUNTER — Telehealth (INDEPENDENT_AMBULATORY_CARE_PROVIDER_SITE_OTHER): Payer: Self-pay | Admitting: General Surgery

## 2011-09-22 NOTE — Telephone Encounter (Signed)
PT CALLED TO REPORT THAT SHE HAS BEEN EXPERIENCING INTERMITTENT VISION SYMPTOMS/ SENSITIVITY TO LIGHT WITH EYES WATERING AND TROUBLE FOCUSING WHEN TRYING TO READ AT TIMES. SHE ALSO NOTICED THESE SYMPTOMS WHILE IN HOSPITAL. PT STATES  NO FEVER OR PROBLEMS WITH WOUND CARE/ I REVIEWED THIS WITH DR. HOXWORTH. IT MAY BE RELATED TO ANESTHESIA OR LOW FLUID VOLUME. CONTINUE FLUID INTAKE AND MONITOR FREQUENCY OF OCCURENCES IF IT DOES NOT IMPROVE OVER NEXT DAY OR 2 NOTIFY OUR OFFICE/ PT ADVISED/GY

## 2011-09-25 ENCOUNTER — Telehealth (INDEPENDENT_AMBULATORY_CARE_PROVIDER_SITE_OTHER): Payer: Self-pay | Admitting: General Surgery

## 2011-09-25 NOTE — Telephone Encounter (Signed)
Pt called to report serosanguinous fluid is draining from surgical site.  She denies pain, redness or other symptoms of infection.  Reassured pt that and explained what she was seeing and that it is not unexpected.  Advised her to keep it as clean and dry as possible, changing gauze as needed.  She will comply and call back if any signs of infection.

## 2011-09-29 ENCOUNTER — Ambulatory Visit (INDEPENDENT_AMBULATORY_CARE_PROVIDER_SITE_OTHER): Payer: BC Managed Care – PPO | Admitting: Internal Medicine

## 2011-09-29 ENCOUNTER — Encounter (INDEPENDENT_AMBULATORY_CARE_PROVIDER_SITE_OTHER): Payer: Self-pay

## 2011-09-29 VITALS — BP 110/60 | HR 92 | Temp 97.1°F | Resp 16 | Ht 60.5 in | Wt 160.0 lb

## 2011-09-29 DIAGNOSIS — K35209 Acute appendicitis with generalized peritonitis, without abscess, unspecified as to perforation: Secondary | ICD-10-CM

## 2011-09-29 DIAGNOSIS — IMO0002 Reserved for concepts with insufficient information to code with codable children: Secondary | ICD-10-CM

## 2011-09-29 DIAGNOSIS — K3532 Acute appendicitis with perforation and localized peritonitis, without abscess: Secondary | ICD-10-CM

## 2011-09-29 DIAGNOSIS — K352 Acute appendicitis with generalized peritonitis, without abscess: Secondary | ICD-10-CM

## 2011-09-29 NOTE — Progress Notes (Signed)
Patient ID: Kara [name], female   DOB: May 23, 1945, 66 y.o.   MRN: [id number]  Subjective: Pt returns to clinic after undergoing open appendectomy on 9/3.  She has had a large amount of clear yellow drainage from her wound but no fevers or purulence.  She is having to change her dressing twice daily at least.  She is having no problems with her BMs, diet or pain.  Objective: Vital signs in last 24 hours: Reviewed.  PE: Abd: skin tissue is approximated but appears weepy and a fluid collection can be felt under.  Sutures are removed and the skin tissue on the lateral edge separated.  Gentle pressure was applied to the area and a large amount of high pressure seroma liquid was expressed.  Then gentle pressure was used to milk out about 60ccs of remaining liquid was expressed.  The fluid was serosang and did not appear infected, no odor.  Fascia is intact.  Wet to dry packing was placed in the wound and dry dressing over top.  Lab Results:  No results found for this basename: WBC:2,HGB:2,HCT:2,PLT:2 in the last 72 hours BMET No results found for this basename: NA:2,K:2,CL:2,CO2:2,GLUCOSE:2,BUN:2,CREATININE:2,CALCIUM:2 in the last 72 hours PT/INR No results found for this basename: LABPROT:2,INR:2 in the last 72 hours CMP     Component Value Date/Time   NA 136 09/18/2011 0413   K 3.6 09/18/2011 0413   CL 104 09/18/2011 0413   CO2 23 09/18/2011 0413   GLUCOSE 103* 09/18/2011 0413   BUN 5* 09/18/2011 0413   CREATININE 0.49* 09/18/2011 0413   CALCIUM 7.4* 09/18/2011 0413   PROT 4.7* 09/18/2011 0413   ALBUMIN 2.0* 09/18/2011 0413   AST 17 09/18/2011 0413   ALT 18 09/18/2011 0413   ALKPHOS 78 09/18/2011 0413   BILITOT 0.3 09/18/2011 0413   GFRNONAA >90 09/18/2011 0413   GFRAA >90 09/18/2011 0413   Lipase     Component Value Date/Time   LIPASE 58 09/15/2011 1525       Studies/Results: No results found.  Anti-infectives: Anti-infectives    None       Assessment/Plan  1.  PO Open appendectomy with non  infected post-operative seroma: the patient now has an open wound that will need to have wet to dry dressings daily.  The patient is unable to do this so she will come to our clinic daily and undergo teaching so that she can do this over the weekend.  We will see her back next week for wound check.   WHITE, ELIZABETH 09/29/2011

## 2011-09-29 NOTE — Patient Instructions (Addendum)
Come in once daily for wet to dry dressing changes until Friday.  Then over weekend will need to do once daily wet to dry dressing changes.  Change outside dressing at least twice daily and as needed for saturation of dressing.  Follow up in clinic next week.

## 2011-09-30 ENCOUNTER — Ambulatory Visit (INDEPENDENT_AMBULATORY_CARE_PROVIDER_SITE_OTHER): Payer: BC Managed Care – PPO | Admitting: General Surgery

## 2011-09-30 ENCOUNTER — Encounter (INDEPENDENT_AMBULATORY_CARE_PROVIDER_SITE_OTHER): Payer: Self-pay | Admitting: General Surgery

## 2011-09-30 VITALS — BP 124/60 | HR 91 | Temp 97.5°F | Ht 60.5 in | Wt 161.2 lb

## 2011-09-30 DIAGNOSIS — T148XXA Other injury of unspecified body region, initial encounter: Secondary | ICD-10-CM

## 2011-09-30 NOTE — Progress Notes (Signed)
Patient comes in today for wound check and dressing change S/P open appendectomy on 09/15/11 by Dr.Hoxworth...patient was seen by Marisue Ivan our P.A. In the [facility] clinic on 09/29/11 and had to reopen the incision due to a seroma under the skin...removed packing from yesterday...incision looked well with no signs of infection and reapplied wet to dry dressing per instructions by Marisue Ivan...pt tolerated very well and will come and see me again for the rest of this week and next week in [facility]..pt will call the office is she has any questions or concerns.Marland KitchenMarland Kitchen

## 2011-10-01 ENCOUNTER — Ambulatory Visit (INDEPENDENT_AMBULATORY_CARE_PROVIDER_SITE_OTHER): Payer: BC Managed Care – PPO | Admitting: General Surgery

## 2011-10-01 ENCOUNTER — Encounter (INDEPENDENT_AMBULATORY_CARE_PROVIDER_SITE_OTHER): Payer: Self-pay | Admitting: General Surgery

## 2011-10-01 VITALS — BP 136/62 | HR 92 | Ht 60.25 in | Wt 160.4 lb

## 2011-10-01 DIAGNOSIS — IMO0001 Reserved for inherently not codable concepts without codable children: Secondary | ICD-10-CM

## 2011-10-01 DIAGNOSIS — Z4800 Encounter for change or removal of nonsurgical wound dressing: Secondary | ICD-10-CM

## 2011-10-01 NOTE — Progress Notes (Signed)
Patient comes in today for a wound check and dressing change S/P open appendectomy on 09/15/11 by Dr.Hoxworth..removed packing from nurse only visit yesterday 9/18 intact...wound healing well with less drainage and zero sign of infection...reapplied we to dry dressing..the patient tolerated well and will call the office is any concerns arise.Kara Kitchenotherwise she will come in tomorrow for another nurse only visit.Kara KitchenMarland [name]

## 2011-10-02 ENCOUNTER — Encounter (INDEPENDENT_AMBULATORY_CARE_PROVIDER_SITE_OTHER): Payer: Self-pay | Admitting: General Surgery

## 2011-10-02 ENCOUNTER — Ambulatory Visit (INDEPENDENT_AMBULATORY_CARE_PROVIDER_SITE_OTHER): Payer: BC Managed Care – PPO | Admitting: General Surgery

## 2011-10-02 VITALS — BP 100/60 | HR 94 | Temp 97.8°F | Ht 60.25 in | Wt 160.4 lb

## 2011-10-02 DIAGNOSIS — Z4800 Encounter for change or removal of nonsurgical wound dressing: Secondary | ICD-10-CM

## 2011-10-02 NOTE — Progress Notes (Signed)
Patient comes in today for a wound check and dressing change S/P open appendectomy on 09/15/11 by Dr.Hoxworth...removed packing from nurse only visit yesterday on 9/19 intact..The wound is healing well with less drainage and no signs of infection...let patient perform wet to dry dressing change with supervision from me since the patient would be performing them on herself over the weekend which patient asked to do so she would feel more comfortable...patient tolerated and performed dressing change with no problems...patient will perform dressing changes on herself over the weekend and then come back on Monday 9/23 for another nurse only visit before seeing the P.A on Tuesday the 24th.Marland KitchenMarland Kitchen

## 2011-10-05 ENCOUNTER — Ambulatory Visit (INDEPENDENT_AMBULATORY_CARE_PROVIDER_SITE_OTHER): Payer: BC Managed Care – PPO | Admitting: General Surgery

## 2011-10-05 ENCOUNTER — Telehealth (INDEPENDENT_AMBULATORY_CARE_PROVIDER_SITE_OTHER): Payer: Self-pay | Admitting: General Surgery

## 2011-10-05 ENCOUNTER — Encounter (INDEPENDENT_AMBULATORY_CARE_PROVIDER_SITE_OTHER): Payer: Self-pay

## 2011-10-05 VITALS — BP 108/76 | HR 90 | Temp 98.2°F | Ht 60.03 in | Wt 161.2 lb

## 2011-10-05 DIAGNOSIS — Z4800 Encounter for change or removal of nonsurgical wound dressing: Secondary | ICD-10-CM

## 2011-10-05 NOTE — Telephone Encounter (Signed)
LMOM confirming pt's appt date and time tomorrow..9/24 @2 :45 with [facility]

## 2011-10-05 NOTE — Progress Notes (Signed)
Patient comes in today for a wound check and dressing change S/P open appendectomy on 09/15/11 by Dr. Johna Sheriff..removed packing from incision intact...incision healing nicely with zero signs of infection with barely any drainage...wet to dry dressing was placed over wound and pt has appt with [facility] clinic tomorrow 9/24 at 2:45

## 2011-10-06 ENCOUNTER — Ambulatory Visit (INDEPENDENT_AMBULATORY_CARE_PROVIDER_SITE_OTHER): Payer: BC Managed Care – PPO | Admitting: General Surgery

## 2011-10-06 ENCOUNTER — Encounter (INDEPENDENT_AMBULATORY_CARE_PROVIDER_SITE_OTHER): Payer: Self-pay | Admitting: General Surgery

## 2011-10-06 VITALS — BP 138/74 | HR 102 | Temp 97.4°F | Ht 60.25 in | Wt 160.6 lb

## 2011-10-06 DIAGNOSIS — K35209 Acute appendicitis with generalized peritonitis, without abscess, unspecified as to perforation: Secondary | ICD-10-CM

## 2011-10-06 DIAGNOSIS — IMO0002 Reserved for concepts with insufficient information to code with codable children: Secondary | ICD-10-CM

## 2011-10-06 DIAGNOSIS — K352 Acute appendicitis with generalized peritonitis, without abscess: Secondary | ICD-10-CM

## 2011-10-06 DIAGNOSIS — K3532 Acute appendicitis with perforation and localized peritonitis, without abscess: Secondary | ICD-10-CM

## 2011-10-06 NOTE — Progress Notes (Signed)
Kara [name] 2045/08/12  Kara [name] presents today for a wound check.  She had an open appendectomy and wound was opened for a seroma.  She is doing well today except for a complaint of a rash.  She is no longer on abx therapy and has not started any new meds or detergents recently.  PE: Abd: soft, NT, ND, wound is 100% clean and about 2x 0.5x 0.5cm. Skin: diffuse rash  A/P: 1. S/p open appendectomy, open wound 2. Suspect contact dermatitis  Plan: 1. Cont daily dressing changes to abdominal wound. 2. Come back to [facility] 10-20-11 for recheck 3. Recommend anti-itch cream and benadryl prn rash.  She may follow up with her PCP if no improvement.

## 2011-10-06 NOTE — Patient Instructions (Signed)
Benadryl as need for rash Follow up in 2 weeks for recheck Daily dressing changes

## 2011-10-07 ENCOUNTER — Ambulatory Visit (INDEPENDENT_AMBULATORY_CARE_PROVIDER_SITE_OTHER): Payer: BC Managed Care – PPO | Admitting: General Surgery

## 2011-10-07 ENCOUNTER — Encounter (INDEPENDENT_AMBULATORY_CARE_PROVIDER_SITE_OTHER): Payer: Self-pay | Admitting: General Surgery

## 2011-10-07 VITALS — BP 118/72 | HR 82 | Temp 98.6°F | Ht 60.25 in | Wt 159.0 lb

## 2011-10-07 DIAGNOSIS — Z4800 Encounter for change or removal of nonsurgical wound dressing: Secondary | ICD-10-CM

## 2011-10-07 NOTE — Progress Notes (Signed)
Patient comes in today for wound check and dressing change...wound healing nicely with zero signs of infection or drainage.the patient has another nurse only visit tomorrow 9/26@11 :30.Marland KitchenMarland Kitchen

## 2011-10-08 ENCOUNTER — Ambulatory Visit (INDEPENDENT_AMBULATORY_CARE_PROVIDER_SITE_OTHER): Payer: BC Managed Care – PPO | Admitting: General Surgery

## 2011-10-08 ENCOUNTER — Encounter (INDEPENDENT_AMBULATORY_CARE_PROVIDER_SITE_OTHER): Payer: Self-pay | Admitting: General Surgery

## 2011-10-08 VITALS — BP 114/64 | Temp 98.3°F | Ht 60.25 in | Wt 158.8 lb

## 2011-10-08 DIAGNOSIS — Z4800 Encounter for change or removal of nonsurgical wound dressing: Secondary | ICD-10-CM

## 2011-10-08 NOTE — Progress Notes (Signed)
Patient comes in today for wound check and wet to dry dressing change...this will be the 2nd week of nurse only visits and the wound is healing very nicely with zero drainage or infection...the patient doesn't have any complaints or concerns and she will come to see me again tomorrow.Marland Kitchen

## 2011-10-09 ENCOUNTER — Ambulatory Visit (INDEPENDENT_AMBULATORY_CARE_PROVIDER_SITE_OTHER): Payer: BC Managed Care – PPO | Admitting: General Surgery

## 2011-10-09 ENCOUNTER — Encounter (INDEPENDENT_AMBULATORY_CARE_PROVIDER_SITE_OTHER): Payer: Self-pay | Admitting: General Surgery

## 2011-10-09 VITALS — BP 130/74 | HR 93 | Temp 97.1°F | Ht 60.25 in

## 2011-10-09 DIAGNOSIS — Z4800 Encounter for change or removal of nonsurgical wound dressing: Secondary | ICD-10-CM

## 2011-10-09 NOTE — Progress Notes (Signed)
Patient comes in today for her routine dressing change..wound healing nicely with zero signs of infection or drainage...pt has no complaints and will change her dressing daily over the weekend as instructed by kelly and the same as she has done for the weekend prior..pt is fine with all instructions and knows to call if any questions or concerns may arise..the patient has follow up on 9/30@11 :30 to see me.Marland Kitchen

## 2011-10-12 ENCOUNTER — Encounter (INDEPENDENT_AMBULATORY_CARE_PROVIDER_SITE_OTHER): Payer: Self-pay | Admitting: General Surgery

## 2011-10-12 ENCOUNTER — Ambulatory Visit (INDEPENDENT_AMBULATORY_CARE_PROVIDER_SITE_OTHER): Payer: BC Managed Care – PPO | Admitting: General Surgery

## 2011-10-12 VITALS — BP 124/72 | HR 83 | Temp 97.5°F | Ht 60.25 in

## 2011-10-12 DIAGNOSIS — Z4800 Encounter for change or removal of nonsurgical wound dressing: Secondary | ICD-10-CM

## 2011-10-12 NOTE — Progress Notes (Signed)
Patient comes in today for nurse only dressing change..The wound is healing up very nicely and with zero drainage and zero sign of infection...for the past two weekends patient has had to do her own dressing changes and with the cavity almost closed she had to repack the dressing this a.m after her shower and wanted to know if she could pack her own wound instead of having to continue to drive out here everyday until her next appt on 10/8 with the [facility] clinic...called in Dr.Ramirez who looked over the wound and stated that this should be fine for her to take care of..that the incision was healing great but advised her if any signs of infection on fever should appear to definitely give [place] a call and come back in.Marland KitchenMarland KitchenI also advised patient of the same info he gave her once again before she left and told her that we also have doctors on call 24/7 should anything happen after hours... Patient was delighted with the information she received and dressing was removed and wet to dry dressing was replaced...patient will keep her appt on 10/8 and will call before then if anything may arise.Marland KitchenMarland Kitchen

## 2011-10-13 ENCOUNTER — Encounter (INDEPENDENT_AMBULATORY_CARE_PROVIDER_SITE_OTHER): Payer: BC Managed Care – PPO

## 2011-10-14 ENCOUNTER — Encounter (INDEPENDENT_AMBULATORY_CARE_PROVIDER_SITE_OTHER): Payer: BC Managed Care – PPO

## 2011-10-15 ENCOUNTER — Encounter (INDEPENDENT_AMBULATORY_CARE_PROVIDER_SITE_OTHER): Payer: BC Managed Care – PPO

## 2011-10-16 ENCOUNTER — Encounter (INDEPENDENT_AMBULATORY_CARE_PROVIDER_SITE_OTHER): Payer: BC Managed Care – PPO

## 2011-10-19 ENCOUNTER — Encounter (INDEPENDENT_AMBULATORY_CARE_PROVIDER_SITE_OTHER): Payer: BC Managed Care – PPO

## 2011-10-20 ENCOUNTER — Encounter (INDEPENDENT_AMBULATORY_CARE_PROVIDER_SITE_OTHER): Payer: Self-pay

## 2011-10-20 ENCOUNTER — Ambulatory Visit (INDEPENDENT_AMBULATORY_CARE_PROVIDER_SITE_OTHER): Payer: BC Managed Care – PPO | Admitting: Internal Medicine

## 2011-10-20 VITALS — BP 130/76 | HR 82 | Temp 98.0°F | Resp 18 | Ht 60.0 in | Wt 159.8 lb

## 2011-10-20 DIAGNOSIS — IMO0002 Reserved for concepts with insufficient information to code with codable children: Secondary | ICD-10-CM

## 2011-10-20 NOTE — Patient Instructions (Signed)
Avoid heavy lifting for another 2 weeks then slowly increase.

## 2011-10-20 NOTE — Progress Notes (Signed)
  Subjective: Pt returns to the clinic today for recheck of her abdominal wound.  It has stopped draining now and is closed.  Objective: Vital signs in last 24 hours: Reviewed  PE: Abd: soft, well healed wound now, no drainage   Lab Results:  No results found for this basename: WBC:2,HGB:2,HCT:2,PLT:2 in the last 72 hours BMET No results found for this basename: NA:2,K:2,CL:2,CO2:2,GLUCOSE:2,BUN:2,CREATININE:2,CALCIUM:2 in the last 72 hours PT/INR No results found for this basename: LABPROT:2,INR:2 in the last 72 hours CMP     Component Value Date/Time   NA 136 09/18/2011 0413   K 3.6 09/18/2011 0413   CL 104 09/18/2011 0413   CO2 23 09/18/2011 0413   GLUCOSE 103* 09/18/2011 0413   BUN 5* 09/18/2011 0413   CREATININE 0.49* 09/18/2011 0413   CALCIUM 7.4* 09/18/2011 0413   PROT 4.7* 09/18/2011 0413   ALBUMIN 2.0* 09/18/2011 0413   AST 17 09/18/2011 0413   ALT 18 09/18/2011 0413   ALKPHOS 78 09/18/2011 0413   BILITOT 0.3 09/18/2011 0413   GFRNONAA >90 09/18/2011 0413   GFRAA >90 09/18/2011 0413   Lipase     Component Value Date/Time   LIPASE 58 09/15/2011 1525       Studies/Results: No results found.  Anti-infectives: Anti-infectives    None       Assessment/Plan  1.  S/P open appendectomy with seroma post-op: well healed now, may leave open to air, will have her f/u as needed, call with concerns.     Kara [name] 10/20/2011

## 2012-09-05 ENCOUNTER — Ambulatory Visit (HOSPITAL_COMMUNITY): Admission: RE | Admit: 2012-09-05 | Payer: Medicare PPO | Source: Ambulatory Visit | Admitting: Gastroenterology

## 2012-09-05 SURGERY — COLONOSCOPY
Anesthesia: Moderate Sedation

## 2014-11-12 ENCOUNTER — Other Ambulatory Visit: Payer: Self-pay

## 2014-11-12 DIAGNOSIS — C4492 Squamous cell carcinoma of skin, unspecified: Secondary | ICD-10-CM

## 2014-11-12 HISTORY — DX: Squamous cell carcinoma of skin, unspecified: C44.92

## 2014-12-17 ENCOUNTER — Other Ambulatory Visit: Payer: Self-pay | Admitting: Dermatology

## 2015-04-15 ENCOUNTER — Encounter (HOSPITAL_COMMUNITY): Payer: Self-pay | Admitting: *Deleted

## 2015-04-17 ENCOUNTER — Other Ambulatory Visit: Payer: Self-pay | Admitting: Gastroenterology

## 2015-04-23 ENCOUNTER — Other Ambulatory Visit: Payer: Self-pay | Admitting: Gastroenterology

## 2015-04-23 NOTE — Anesthesia Preprocedure Evaluation (Addendum)
Anesthesia Evaluation  Patient identified by MRN, date of birth, ID band Patient awake    Reviewed: Allergy & Precautions, NPO status , Patient's Chart, lab work & pertinent test results  History of Anesthesia Complications (+) DIFFICULT AIRWAY and history of anesthetic complications (previous intubation note: grade 1 view with Miller 2.)  Airway Mallampati: IV  TM Distance: <3 FB Neck ROM: Full    Dental  (+) Teeth Intact, Dental Advisory Given, Caps,    Pulmonary neg pulmonary ROS,    Pulmonary exam normal breath sounds clear to auscultation       Cardiovascular Exercise Tolerance: Good negative cardio ROS Normal cardiovascular exam Rhythm:Regular Rate:Normal     Neuro/Psych negative neurological ROS  negative psych ROS   GI/Hepatic Neg liver ROS, GERD  ,  Endo/Other  Obesity   Renal/GU negative Renal ROS     Musculoskeletal  (+) Arthritis ,   Abdominal   Peds  Hematology negative hematology ROS (+)   Anesthesia Other Findings Day of surgery medications reviewed with the patient.  Breast cancer s/p mastectomy 2008  Reproductive/Obstetrics                           Anesthesia Physical Anesthesia Plan  ASA: II  Anesthesia Plan: MAC   Post-op Pain Management:    Induction: Intravenous  Airway Management Planned: Nasal Cannula  Additional Equipment:   Intra-op Plan:   Post-operative Plan:   Informed Consent: I have reviewed the patients History and Physical, chart, labs and discussed the procedure including the risks, benefits and alternatives for the proposed anesthesia with the patient or authorized representative who has indicated his/her understanding and acceptance.   Dental advisory given  Plan Discussed with: CRNA and Anesthesiologist  Anesthesia Plan Comments: (Discussed risks/benefits/alternatives to MAC sedation including need for ventilatory support,  hypotension, need for conversion to general anesthesia.  All patient questions answered.  Patient/guardian wishes to proceed.  History of difficult intubation with mastectomy. Had appendectomy more recently with documented Grade 1 view of glottis with Miller 2 blade with no mention of difficulty.)       Anesthesia Quick Evaluation

## 2015-04-24 ENCOUNTER — Ambulatory Visit (HOSPITAL_COMMUNITY)
Admission: RE | Admit: 2015-04-24 | Discharge: 2015-04-24 | Disposition: A | Discharge status: Home / Self Care | Payer: Medicare Other | Source: Ambulatory Visit | Attending: Gastroenterology | Admitting: Gastroenterology

## 2015-04-24 ENCOUNTER — Ambulatory Visit (HOSPITAL_COMMUNITY): Payer: Medicare Other | Admitting: Anesthesiology

## 2015-04-24 ENCOUNTER — Encounter (HOSPITAL_COMMUNITY)
Admission: RE | Disposition: A | Discharge status: Home / Self Care | Payer: Self-pay | Source: Ambulatory Visit | Attending: Gastroenterology

## 2015-04-24 ENCOUNTER — Encounter (HOSPITAL_COMMUNITY): Payer: Self-pay | Admitting: *Deleted

## 2015-04-24 DIAGNOSIS — Z9010 Acquired absence of unspecified breast and nipple: Secondary | ICD-10-CM | POA: Diagnosis not present

## 2015-04-24 DIAGNOSIS — Z1211 Encounter for screening for malignant neoplasm of colon: Secondary | ICD-10-CM | POA: Insufficient documentation

## 2015-04-24 DIAGNOSIS — Z853 Personal history of malignant neoplasm of breast: Secondary | ICD-10-CM | POA: Insufficient documentation

## 2015-04-24 DIAGNOSIS — Z6829 Body mass index (BMI) 29.0-29.9, adult: Secondary | ICD-10-CM | POA: Diagnosis not present

## 2015-04-24 DIAGNOSIS — K5730 Diverticulosis of large intestine without perforation or abscess without bleeding: Secondary | ICD-10-CM | POA: Insufficient documentation

## 2015-04-24 DIAGNOSIS — E669 Obesity, unspecified: Secondary | ICD-10-CM | POA: Insufficient documentation

## 2015-04-24 HISTORY — PX: COLONOSCOPY WITH PROPOFOL: SHX5780

## 2015-04-24 SURGERY — COLONOSCOPY WITH PROPOFOL
Anesthesia: Monitor Anesthesia Care

## 2015-04-24 MED ORDER — SODIUM CHLORIDE 0.9 % IV SOLN: INTRAVENOUS | Status: DC

## 2015-04-24 MED ORDER — LACTATED RINGERS IV SOLN
INTRAVENOUS | Status: DC
  Administered 2015-04-24: 1000 mL via INTRAVENOUS

## 2015-04-24 MED ORDER — PROPOFOL 500 MG/50ML IV EMUL
INTRAVENOUS | Status: DC | PRN
  Administered 2015-04-24: 120 ug/kg/min via INTRAVENOUS

## 2015-04-24 MED ORDER — PROPOFOL 10 MG/ML IV BOLUS
INTRAVENOUS | Status: DC | PRN
  Administered 2015-04-24: 10 mg via INTRAVENOUS
  Administered 2015-04-24 (×2): 20 mg via INTRAVENOUS

## 2015-04-24 MED ORDER — LIDOCAINE HCL (CARDIAC) 20 MG/ML IV SOLN
INTRAVENOUS | Status: DC | PRN
  Administered 2015-04-24: 25 mg via INTRATRACHEAL

## 2015-04-24 SURGICAL SUPPLY — 21 items

## 2015-04-24 NOTE — Transfer of Care (Signed)
Immediate Anesthesia Transfer of Care Note  Patient: Kara [name]  Procedure(s) Performed: Procedure(s): COLONOSCOPY WITH PROPOFOL (N/A)  Patient Location: PACU and Endoscopy Unit  Anesthesia Type:MAC  Level of Consciousness: sedated and patient cooperative  Airway & Oxygen Therapy: Patient Spontanous Breathing and Patient connected to face mask oxygen  Post-op Assessment: Report given to RN and Post -op Vital signs reviewed and stable  Post vital signs: Reviewed and stable  Last Vitals:  Filed Vitals:   04/24/15 0738  BP: 130/46  Pulse: 68  Temp: 36.6 C  Resp: 12    Complications: No apparent anesthesia complications

## 2015-04-24 NOTE — Anesthesia Postprocedure Evaluation (Signed)
Anesthesia Post Note  Patient: Kara [name]  Procedure(s) Performed: Procedure(s) (LRB): COLONOSCOPY WITH PROPOFOL (N/A)  Patient location during evaluation: Endoscopy Anesthesia Type: MAC Level of consciousness: awake and alert Pain management: pain level controlled Vital Signs Assessment: post-procedure vital signs reviewed and stable Respiratory status: spontaneous breathing, nonlabored ventilation, respiratory function stable and patient connected to nasal cannula oxygen Cardiovascular status: stable and blood pressure returned to baseline Anesthetic complications: no    Last Vitals:  Filed Vitals:   04/24/15 0920 04/24/15 0930  BP: 108/54 118/61  Pulse: 71 66  Temp:    Resp: 20 14    Last Pain: There were no vitals filed for this visit.               Catalina Gravel

## 2015-04-24 NOTE — Op Note (Signed)
[facility] Patient Name: Kara [name] Procedure Date: 04/24/2015 MRN: [id number] Attending MD: Arta Silence , MD Date of Birth: 1946/09/20 CSN:  Age: 70 Admit Type: Outpatient Procedure:                Colonoscopy Indications:              Screening for colorectal malignant neoplasm, Last                            colonoscopy 10 years ago Providers:                Arta Silence, MD, Cleda Daub, RN, Alfonso Patten,                            Technician, Dione Booze, CRNA Referring MD:             Maurice Small, MD Medicines:                Propofol per Anesthesia Complications:            No immediate complications. Estimated Blood Loss:     Estimated blood loss: none. Procedure:                Pre-Anesthesia Assessment:                           - Prior to the procedure, a History and Physical                            was performed, and patient medications and                            allergies were reviewed. The patient's tolerance of                            previous anesthesia was also reviewed. The risks                            and benefits of the procedure and the sedation                            options and risks were discussed with the patient.                            All questions were answered, and informed consent                            was obtained. Prior Anticoagulants: The patient has                            taken no previous anticoagulant or antiplatelet                            agents. ASA Grade Assessment: II - A patient with  mild systemic disease. After reviewing the risks                            and benefits, the patient was deemed in                            satisfactory condition to undergo the procedure.                           After obtaining informed consent, the colonoscope                            was passed under direct vision. Throughout the   procedure, the patient's blood pressure, pulse, and                            oxygen saturations were monitored continuously. The                            [id number]) scope was introduced through                            the anus and advanced to the the cecum, identified                            by appendiceal orifice and ileocecal valve. The                            ileocecal valve, appendiceal orifice, and rectum                            were photographed. The entire colon was examined.                            The colonoscopy was performed without difficulty.                            The patient tolerated the procedure well. The                            quality of the bowel preparation was good. Scope In: 8:40:35 AM Scope Out: 9:00:57 AM Scope Withdrawal Time: 0 hours 7 minutes 22 seconds  Total Procedure Duration: 0 hours 20 minutes 22 seconds  Findings:      The perianal and digital rectal examinations were normal.      A few small-mouthed diverticula were found in the sigmoid colon.      Colon otherwise normal; no other polyps, masses, vascular ectasias, or       inflammatory changes were seen.      The retroflexed view of the distal rectum and anal verge was normal and       showed no anal or rectal abnormalities. Impression:               - Diverticulosis in the sigmoid colon.                           -  The distal rectum and anal verge are normal on                            retroflexion view.                           - The examination was otherwise normal. Moderate Sedation:      None Recommendation:           - Patient has a contact number available for                            emergencies. The signs and symptoms of potential                            delayed complications were discussed with the                            patient. Return to normal activities tomorrow.                            Written discharge instructions were provided to  the                            patient.                           - Discharge patient to home (via wheelchair).                           - High fiber diet indefinitely.                           - Continue present medications.                           - Repeat colonoscopy in 10 years for screening                            purposes, pending patient's state-of-health at that                            time. Patient would need office visit prior to                            setting up a subsequent 10-year screening                            colonoscopy.                           - Return to GI clinic PRN.                           - Return to referring physician as previously  scheduled. Procedure Code(s):        --- Professional ---                           [id number], Colorectal cancer screening; colonoscopy on                            individual not meeting criteria for high risk Diagnosis Code(s):        --- Professional ---                           Z12.11, Encounter for screening for malignant                            neoplasm of colon                           K57.30, Diverticulosis of large intestine without                            perforation or abscess without bleeding CPT copyright 2016 American Medical Association. All rights reserved. The codes documented in this report are preliminary and upon coder review may  be revised to meet current compliance requirements. Arta Silence, MD Arta Silence, MD 04/24/2015 9:07:25 AM This report has been signed electronically. Number of Addenda: 0

## 2015-04-24 NOTE — Discharge Instructions (Signed)

## 2015-04-24 NOTE — H&P (Signed)
Patient interval history reviewed.  Patient examined again.  There has been no change from documented H/P dated 04/23/15 (scanned into chart from our office) except as documented above.  Assessment:  1.  Colon cancer screening, average-risk, last colonoscopy elsewhere ~ 12 years ago. 2.  History of prior difficult intubation.  Plan:  1.  Colonoscopy. 2.  Risks (bleeding, infection, bowel perforation that could require surgery, sedation-related changes in cardiopulmonary systems), benefits (identification and possible treatment of source of symptoms, exclusion of certain causes of symptoms), and alternatives (watchful waiting, radiographic imaging studies, empiric medical treatment) of colonoscopy were explained to patient/family in detail and patient wishes to proceed.

## 2015-04-25 ENCOUNTER — Encounter (HOSPITAL_COMMUNITY): Payer: Self-pay | Admitting: Gastroenterology

## 2016-04-20 ENCOUNTER — Other Ambulatory Visit: Payer: Self-pay

## 2016-06-04 ENCOUNTER — Other Ambulatory Visit: Payer: Self-pay | Admitting: Dermatology

## 2016-09-23 ENCOUNTER — Encounter: Payer: Self-pay | Admitting: Genetics

## 2016-09-24 ENCOUNTER — Telehealth: Payer: Self-pay | Admitting: Genetics

## 2016-09-24 ENCOUNTER — Telehealth: Payer: Self-pay | Admitting: Genetic Counselor

## 2016-09-24 ENCOUNTER — Other Ambulatory Visit: Payer: BC Managed Care – PPO

## 2016-09-24 ENCOUNTER — Encounter: Payer: BC Managed Care – PPO | Admitting: Genetics

## 2016-09-24 NOTE — Telephone Encounter (Signed)
Pt cld and rescheduled genetic counseling appt to 9/20 at 930am. Aware to arrive early to get checked in on time.

## 2016-09-24 NOTE — Telephone Encounter (Signed)
lft a vm to reschedule appt

## 2016-10-01 ENCOUNTER — Ambulatory Visit (HOSPITAL_BASED_OUTPATIENT_CLINIC_OR_DEPARTMENT_OTHER): Payer: Medicare Other | Admitting: Genetic Counselor

## 2016-10-01 ENCOUNTER — Other Ambulatory Visit: Payer: Medicare Other

## 2016-10-01 ENCOUNTER — Encounter: Payer: Self-pay | Admitting: Genetic Counselor

## 2016-10-01 DIAGNOSIS — Z853 Personal history of malignant neoplasm of breast: Secondary | ICD-10-CM | POA: Diagnosis not present

## 2016-10-01 DIAGNOSIS — Z803 Family history of malignant neoplasm of breast: Secondary | ICD-10-CM

## 2016-10-01 DIAGNOSIS — Z85828 Personal history of other malignant neoplasm of skin: Secondary | ICD-10-CM | POA: Diagnosis not present
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
## 2016-10-12 ENCOUNTER — Encounter: Payer: Self-pay | Admitting: Genetic Counselor

## 2016-10-12 ENCOUNTER — Ambulatory Visit: Payer: Self-pay | Admitting: Genetic Counselor

## 2016-10-12 DIAGNOSIS — Z1379 Encounter for other screening for genetic and chromosomal anomalies: Secondary | ICD-10-CM

## 2016-10-12 NOTE — Progress Notes (Signed)
[facility]       Genetic Test Results    Patient Name: Kara [name] Patient DOB: November 15, 1945 Patient Age: 71 y.o. Encounter Date: 10/12/2016  Referring Provider: Zoe Lan, MD  Primary Care Provider: Maurice Small, MD   [name] was called today to discuss genetic test results. Please see the Genetics note from her visit on 10/01/16 for a detailed discussion of her personal and family history.  Genetic Testing: At the time of [name]'s visit, she decided to pursue genetic testing of multiple genes associated with hereditary susceptibility to cancer. Testing included sequencing and deletion/duplication analysis. Testing did not reveal any pathogenic mutation in any of these genes.  A copy of the genetic test report will be scanned into Epic under the media tab.  The genes analyzed were the 46 genes on [facility]'s Common Cancers panel (APC, ATM, AXIN2, BARD1, BMPR1A, BRCA1, BRCA2, BRIP1, CDH1, CDKN2A, CHEK2, CTNNA1, DICER1, EPCAM, GREM1, HOXB13, KIT, MEN1, MLH1, MSH2, MSH3, MSH6, MUTYH, NBN, NF1, NTHL1, PALB2, PDGFRA, PMS2, POLD1, POLE, PTEN, RAD50, RAD51C, RAD51D, SDHA, SDHB, SDHC, SDHD, SMAD4, SMARCA4, STK11, TP53, TSC1, TSC2, VHL).  Since the current test is not perfect, it is possible that there may be a gene mutation that current testing cannot detect, but that chance is small. It is possible that a different genetic factor, which has not yet been discovered or is not on this panel, is responsible for the cancer diagnoses in the family. Again, the likelihood of this is low. No additional testing is recommended at this time for [name].  Cancer Screening: These results suggest that [name]'s cancer was most likely not due to an inherited predisposition. Most cancers happen by chance and this test, along with details of her family history, suggests that her cancer falls into this category. We discussed continuing to follow the cancer  screening guidelines provided by her physician.   Family Members: Family members are at some increased risk of developing cancer, over the general population risk, simply due to the family history. Women are recommended to have a yearly mammogram beginning at age 69, a yearly clinical breast exam, a yearly gynecologic exam and perform monthly breast self-exams. Colon cancer screening is recommended to begin by age 59 in both men and women, unless there is a family history of colon cancer or colon polyps or an individual has a personal history to warrant initiating screening at a younger age.  Any relative who had cancer at a young age or had a particularly rare cancer may also wish to pursue genetic testing. Genetic counselors can be located in other cities, by visiting the website of the [employer] (ArtistMovie.se) and Field seismologist for a Dietitian by zip code.  Lastly, cancer genetics is a rapidly advancing field and it is possible that new genetic tests will be appropriate for [name] in the future. We encourage her to remain in contact with [place] on an annual basis so we can update her personal and family histories, and let her know of advances in cancer genetics that may benefit the family. Our contact number was provided. [name] is welcome to call anytime with additional questions.     Steele Berg, MS, [place] Certified Genetic Counselor phone: [phone]

## 2016-11-24 ENCOUNTER — Ambulatory Visit (INDEPENDENT_AMBULATORY_CARE_PROVIDER_SITE_OTHER): Payer: Medicare Other

## 2016-11-24 ENCOUNTER — Encounter (INDEPENDENT_AMBULATORY_CARE_PROVIDER_SITE_OTHER): Payer: Self-pay | Admitting: Orthopaedic Surgery

## 2016-11-24 ENCOUNTER — Ambulatory Visit (INDEPENDENT_AMBULATORY_CARE_PROVIDER_SITE_OTHER): Payer: Medicare Other | Admitting: Orthopaedic Surgery

## 2016-11-24 VITALS — BP 121/61 | HR 82 | Resp 14 | Ht 60.5 in | Wt 155.0 lb

## 2016-11-24 DIAGNOSIS — M1711 Unilateral primary osteoarthritis, right knee: Secondary | ICD-10-CM

## 2016-11-24 DIAGNOSIS — M25561 Pain in right knee: Secondary | ICD-10-CM
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
## 2016-12-01 ENCOUNTER — Telehealth (INDEPENDENT_AMBULATORY_CARE_PROVIDER_SITE_OTHER): Payer: Self-pay

## 2016-12-01 ENCOUNTER — Other Ambulatory Visit (INDEPENDENT_AMBULATORY_CARE_PROVIDER_SITE_OTHER): Payer: Self-pay

## 2016-12-01 MED ORDER — DICLOFENAC SODIUM 1 % TD GEL: 2.0000 g | Freq: Four times a day (QID) | TRANSDERMAL | 2 refills | Status: AC | PRN

## 2016-12-01 NOTE — Telephone Encounter (Signed)
Ok

## 2016-12-01 NOTE — Telephone Encounter (Signed)
Patient would like a Rx refill on Voltaren Gel.  Cb# [phone].  Please advise.  Patient uses [facility].  Thank you.

## 2016-12-01 NOTE — Telephone Encounter (Signed)
Done

## 2016-12-01 NOTE — Telephone Encounter (Signed)
OK to do-

## 2016-12-14 ENCOUNTER — Ambulatory Visit: Payer: Medicare Other | Admitting: Physical Therapy

## 2016-12-17 ENCOUNTER — Ambulatory Visit: Payer: Medicare Other | Admitting: Physical Therapy

## 2016-12-24 ENCOUNTER — Ambulatory Visit: Payer: Medicare Other | Admitting: Physical Therapy

## 2016-12-28 ENCOUNTER — Ambulatory Visit (INDEPENDENT_AMBULATORY_CARE_PROVIDER_SITE_OTHER): Payer: Medicare Other | Admitting: Orthopaedic Surgery

## 2017-01-14 ENCOUNTER — Ambulatory Visit: Payer: Medicare Other | Attending: Orthopedic Surgery | Admitting: Physical Therapy

## 2017-01-14 DIAGNOSIS — R262 Difficulty in walking, not elsewhere classified: Secondary | ICD-10-CM | POA: Diagnosis present

## 2017-01-14 DIAGNOSIS — M6281 Muscle weakness (generalized): Secondary | ICD-10-CM | POA: Diagnosis present

## 2017-01-14 DIAGNOSIS — M25661 Stiffness of right knee, not elsewhere classified: Secondary | ICD-10-CM | POA: Diagnosis present

## 2017-01-14 DIAGNOSIS — G8929 Other chronic pain: Secondary | ICD-10-CM | POA: Diagnosis present

## 2017-01-14 DIAGNOSIS — M25561 Pain in right knee: Secondary | ICD-10-CM | POA: Diagnosis present
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
## 2017-01-14 NOTE — Patient Instructions (Signed)
    Copyright  VHI. All rights reserved.  HIP: Flexion / KNEE: Extension, Straight Leg Raise   Raise leg, keeping knee straight. Perform slowly. _15__ reps per set, 2___ sets per day, _6-7__ days per week May add weights as strength increases up to 15 pounds   Copyright  VHI. All rights reserved.  Heel Slide      Copyright  VHI. All rights reserved.  Quad Set   Slowly tighten muscles on thigh of straight leg while counting out loud to _5___. Repeat with other leg. Repeat __50__ times watching TV. Do _1___ sessions per day.  http://gt2.exer.us/361   Copyright  VHI. All rights reserved.    Copyright  VHI. All rights reserved.  Strengthening: Wall Slide   Leaning on wall, slowly lower buttocks until thighs are parallel to floor. Hold __10__ seconds. Tighten thigh muscles and return. Repeat _10___ times per set. Do __1__ sets per session. Do _1-2___ sessions per day.     HIP: Abduction / External Rotation (Band)   Place band around knees. Lie on left  side with hips and knees bent. Raise top knee up, squeezing glutes. Keep feet together. Hold ___ seconds. Use ________ band. ___ reps per set, ___ sets per day, ___ days per week Avoid right side due to vertigo.  Bridge   Lie back, legs bent. Inhale, pressing hips up. Keeping ribs in, lengthen lower back. Exhale, rolling down along spine from top. Repeat _15 x 2___ times. Do _1-2___ sessions per day.    Hip Extension (Prone)   Lift left leg __6__ inches from floor, keeping knee locked. Repeat 15____ times per set. Hold 3 -5 seconds. Do __2__ sets per session. Do _1-2___ sessions per day. Voncille Lo, PT Certified Exercise Expert for the Aging Adult  01/14/17 11:51 AM Phone: [phone] Fax: [phone]

## 2017-01-26 ENCOUNTER — Encounter: Payer: Self-pay | Admitting: Physical Therapy

## 2017-01-26 ENCOUNTER — Ambulatory Visit: Payer: Medicare Other | Admitting: Physical Therapy

## 2017-01-26 DIAGNOSIS — G8929 Other chronic pain: Secondary | ICD-10-CM

## 2017-01-26 DIAGNOSIS — M25661 Stiffness of right knee, not elsewhere classified: Secondary | ICD-10-CM

## 2017-01-26 DIAGNOSIS — M25561 Pain in right knee: Secondary | ICD-10-CM | POA: Diagnosis not present

## 2017-01-26 DIAGNOSIS — M6281 Muscle weakness (generalized): Secondary | ICD-10-CM

## 2017-01-26 DIAGNOSIS — R262 Difficulty in walking, not elsewhere classified: Secondary | ICD-10-CM
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
## 2017-01-26 NOTE — Patient Instructions (Addendum)
Quad Sets    Slowly tighten thigh muscles of straight, left leg while counting out loud to _1___. Relax. Repeat __50__ times. Do __1__ sessions per day.  Support with rolled towel.   May use heat.   http://gt2.exer.us/293   Copyright  VHI. All rights reserved.  Abduction: Clam (Eccentric) - Side-Lying    Lie on side with knees bent. Lift top knee, keeping feet together. Keep trunk steady. Slowly lower for 3-5 seconds. _15 _ reps per set, __2_ sets per day,  May use red band  http://ecce.exer.us/65   Copyright  VHI. All rights reserved.

## 2017-01-28 ENCOUNTER — Ambulatory Visit: Payer: Medicare Other | Admitting: Physical Therapy

## 2017-01-28 DIAGNOSIS — M25661 Stiffness of right knee, not elsewhere classified: Secondary | ICD-10-CM

## 2017-01-28 DIAGNOSIS — M25561 Pain in right knee: Principal | ICD-10-CM

## 2017-01-28 DIAGNOSIS — G8929 Other chronic pain: Secondary | ICD-10-CM

## 2017-01-28 DIAGNOSIS — M6281 Muscle weakness (generalized): Secondary | ICD-10-CM

## 2017-01-28 DIAGNOSIS — R262 Difficulty in walking, not elsewhere classified: Secondary | ICD-10-CM
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
## 2017-01-28 NOTE — Patient Instructions (Addendum)
Calf Stretch    Place one leg forward, bent, other leg behind and straight. Lean forward keeping back heel flat. Hold 30____ seconds while counting out loud. Repeat with other leg forward. Repeat _3___ times. Do _1___ sessions per day.  http://gt2.exer.us/478   Copyright  VHI. All rights reserved.  Wall Sit: Half (Active)    Back against wall, slide down so knees are halfway to parallel with floor. Hold ___ seconds.5-10 Complete _1__ sets of __10_ repetitions. Perform __1_ sessions per day.  Be sure you can see toes.  Press heels into floor/ lift toes. http://gtsc.exer.us/443   Copyright  VHI. All rights reserved.

## 2017-02-02 ENCOUNTER — Ambulatory Visit: Payer: Medicare Other | Admitting: Physical Therapy

## 2017-02-02 ENCOUNTER — Encounter: Payer: Self-pay | Admitting: Physical Therapy

## 2017-02-02 DIAGNOSIS — M25561 Pain in right knee: Principal | ICD-10-CM

## 2017-02-02 DIAGNOSIS — G8929 Other chronic pain: Secondary | ICD-10-CM

## 2017-02-02 DIAGNOSIS — M25661 Stiffness of right knee, not elsewhere classified: Secondary | ICD-10-CM

## 2017-02-02 DIAGNOSIS — R262 Difficulty in walking, not elsewhere classified: Secondary | ICD-10-CM

## 2017-02-02 DIAGNOSIS — M6281 Muscle weakness (generalized): Secondary | ICD-10-CM

## 2017-02-02 NOTE — Patient Instructions (Addendum)
FLEXION: Sitting - Resistance Band (Active)    Sit with right leg extended. Against green resistance band, bend knee and draw foot backward. Complete __1-3_ sets of _10__ repetitions. Perform _1__ sessions per day.  http://gtsc.exer.us/231   Copyright  VHI. All rights reserved.  Also: JOPST  Exercises issued from exercise drawer Hip strengthening mini squat  With green band  5 X 2 steps to right /  Left.  daily
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
## 2017-02-09 ENCOUNTER — Encounter: Payer: Medicare Other | Admitting: Physical Therapy

## 2017-02-11 ENCOUNTER — Encounter: Payer: Self-pay | Admitting: Physical Therapy

## 2017-02-11 ENCOUNTER — Ambulatory Visit: Payer: Medicare Other | Admitting: Physical Therapy

## 2017-02-11 DIAGNOSIS — M25561 Pain in right knee: Principal | ICD-10-CM

## 2017-02-11 DIAGNOSIS — M25661 Stiffness of right knee, not elsewhere classified: Secondary | ICD-10-CM

## 2017-02-11 DIAGNOSIS — M6281 Muscle weakness (generalized): Secondary | ICD-10-CM

## 2017-02-11 DIAGNOSIS — G8929 Other chronic pain: Secondary | ICD-10-CM

## 2017-02-11 DIAGNOSIS — R262 Difficulty in walking, not elsewhere classified: Secondary | ICD-10-CM
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
## 2017-02-11 NOTE — Patient Instructions (Signed)
When your knee cap feels tight against the trochlear groove, use these stretches.  HIP: Flexors - Supine   Lie on edge of surface. Place leg off the surface, allow knee to bend. Bring other knee toward chest. Hold _60__ seconds. Or total 3 minutes __2-3_ reps per set, _1__ sets per day, ___ days per week Rest lowered foot on stool. May use a heating pad over thigh to help stretch and provide comfort   OR you can use this stretchQuads / HF, Prone  Use a strap instead of holding onto foot.  This position stretches the hip flexor and the quad muscle.  Use a heating pad under thigh and perform stretch for 30 -60 seconds and do 2 - 3. Or for 3 minutes total stretch.  Voncille Lo, PT Certified Exercise Expert for the Aging Adult  02/11/17 3:35 PM Phone: [phone] Fax: [phone]

## 2017-02-16 ENCOUNTER — Encounter: Payer: Self-pay | Admitting: Physical Therapy

## 2017-02-16 ENCOUNTER — Ambulatory Visit: Payer: Medicare Other | Attending: Orthopedic Surgery | Admitting: Physical Therapy

## 2017-02-16 DIAGNOSIS — G8929 Other chronic pain: Secondary | ICD-10-CM

## 2017-02-16 DIAGNOSIS — M25661 Stiffness of right knee, not elsewhere classified: Secondary | ICD-10-CM | POA: Diagnosis present

## 2017-02-16 DIAGNOSIS — R262 Difficulty in walking, not elsewhere classified: Secondary | ICD-10-CM | POA: Diagnosis present

## 2017-02-16 DIAGNOSIS — M25561 Pain in right knee: Secondary | ICD-10-CM | POA: Insufficient documentation

## 2017-02-16 DIAGNOSIS — M6281 Muscle weakness (generalized): Secondary | ICD-10-CM | POA: Diagnosis present
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
## 2017-02-18 ENCOUNTER — Ambulatory Visit: Payer: Medicare Other | Admitting: Physical Therapy

## 2017-02-18 ENCOUNTER — Encounter: Payer: Self-pay | Admitting: Physical Therapy

## 2017-02-18 DIAGNOSIS — R262 Difficulty in walking, not elsewhere classified: Secondary | ICD-10-CM

## 2017-02-18 DIAGNOSIS — M25561 Pain in right knee: Secondary | ICD-10-CM | POA: Diagnosis not present

## 2017-02-18 DIAGNOSIS — M25661 Stiffness of right knee, not elsewhere classified: Secondary | ICD-10-CM

## 2017-02-18 DIAGNOSIS — M6281 Muscle weakness (generalized): Secondary | ICD-10-CM

## 2017-02-18 DIAGNOSIS — G8929 Other chronic pain: Secondary | ICD-10-CM

## 2017-02-18 NOTE — Patient Instructions (Addendum)
Step Down: Anterior   From 4-6" step, reach one leg forward as far as possible, still able to return easily. Touch toe and heel to floor. Return to single leg balance. Repeat with other leg. Repeat __15__ times. Do ___2_ sessions per day. At start, hold _0___ pound dumbbells at: knee height. shoulder height. waist height. On return, bring weights: to waist. to shoulders. over head.  Copyright  VHI. All rights reserved.  Step-Up: Lateral   Step up to side with right leg. Bring other foot up onto _6___ inch step. Return to floor position with left leg. Repeat _15___ times per session. Do __2__ sessions per day. Repeat in dimly lit room. Repeat with eyes closed.  Copyright  VHI. All rights reserved.  Forward   Facing step, place one leg on step, flexed at hip. Step up slowly, bringing hips in line with knee and shoulder. Bring other foot onto step. Reverse process to step back down. Repeat with other leg. Do __15__ repetitions, __2__ sets.  Quads / HF, Standing   Stand, holding onto chair and grasping one foot with same-side hand. Pull heel toward buttock. Hold 30___ seconds.  Repeat _2-3__ times per session  May use towel or strap to assist. . Do _1-2__ sessions per day.  Copyright  VHI. All rights reserved.    http://bt.exer.us/154   Copyright  VHI. All rights reserved.  Voncille Lo, PT Certified Exercise Expert for the Aging Adult  02/18/17 3:33 PM Phone: [phone] Fax: [phone]
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
## 2017-02-23 ENCOUNTER — Encounter: Payer: Medicare Other | Admitting: Physical Therapy

## 2017-03-02 ENCOUNTER — Encounter: Payer: Medicare Other | Admitting: Physical Therapy

## 2017-03-04 ENCOUNTER — Ambulatory Visit: Payer: Medicare Other | Admitting: Physical Therapy

## 2017-03-04 ENCOUNTER — Encounter: Payer: Medicare Other | Admitting: Physical Therapy

## 2017-03-04 ENCOUNTER — Encounter: Payer: Self-pay | Admitting: Physical Therapy

## 2017-03-04 DIAGNOSIS — M25561 Pain in right knee: Secondary | ICD-10-CM | POA: Diagnosis not present

## 2017-03-04 DIAGNOSIS — M6281 Muscle weakness (generalized): Secondary | ICD-10-CM

## 2017-03-04 DIAGNOSIS — R262 Difficulty in walking, not elsewhere classified: Secondary | ICD-10-CM

## 2017-03-04 DIAGNOSIS — M25661 Stiffness of right knee, not elsewhere classified: Secondary | ICD-10-CM

## 2017-03-04 DIAGNOSIS — G8929 Other chronic pain: Secondary | ICD-10-CM
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
## 2017-03-11 ENCOUNTER — Encounter: Payer: Medicare Other | Admitting: Physical Therapy

## 2017-03-18 ENCOUNTER — Encounter: Payer: Medicare Other | Admitting: Physical Therapy

## 2017-04-06 ENCOUNTER — Ambulatory Visit: Payer: Medicare Other | Attending: Orthopedic Surgery | Admitting: Physical Therapy

## 2017-04-06 DIAGNOSIS — R262 Difficulty in walking, not elsewhere classified: Secondary | ICD-10-CM | POA: Insufficient documentation

## 2017-04-06 DIAGNOSIS — M25561 Pain in right knee: Secondary | ICD-10-CM | POA: Insufficient documentation

## 2017-04-06 DIAGNOSIS — M6281 Muscle weakness (generalized): Secondary | ICD-10-CM | POA: Insufficient documentation

## 2017-04-06 DIAGNOSIS — G8929 Other chronic pain: Secondary | ICD-10-CM | POA: Insufficient documentation

## 2017-04-06 DIAGNOSIS — M25661 Stiffness of right knee, not elsewhere classified: Secondary | ICD-10-CM | POA: Insufficient documentation
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
## 2017-06-04 ENCOUNTER — Other Ambulatory Visit: Payer: Self-pay

## 2017-08-05 ENCOUNTER — Other Ambulatory Visit: Payer: Self-pay | Admitting: Family Medicine

## 2017-08-05 DIAGNOSIS — Z1231 Encounter for screening mammogram for malignant neoplasm of breast: Secondary | ICD-10-CM

## 2017-08-26 ENCOUNTER — Ambulatory Visit: Payer: Medicare Other

## 2017-09-21 ENCOUNTER — Ambulatory Visit: Payer: Medicare Other

## 2017-10-01 ENCOUNTER — Other Ambulatory Visit: Payer: Self-pay | Admitting: Orthopaedic Surgery

## 2017-10-01 DIAGNOSIS — M545 Low back pain: Secondary | ICD-10-CM

## 2017-10-06 ENCOUNTER — Other Ambulatory Visit: Payer: Medicare Other

## 2017-10-12 ENCOUNTER — Ambulatory Visit
Admission: RE | Admit: 2017-10-12 | Discharge: 2017-10-12 | Disposition: A | Discharge status: Home / Self Care | Payer: Medicare Other | Source: Ambulatory Visit | Attending: Orthopaedic Surgery | Admitting: Orthopaedic Surgery

## 2017-10-12 DIAGNOSIS — M545 Low back pain, unspecified: Secondary | ICD-10-CM

## 2017-10-13 ENCOUNTER — Other Ambulatory Visit: Payer: Medicare Other

## 2017-11-09 ENCOUNTER — Ambulatory Visit
Admission: RE | Admit: 2017-11-09 | Discharge: 2017-11-09 | Disposition: A | Discharge status: Home / Self Care | Payer: Medicare Other | Source: Ambulatory Visit | Attending: Family Medicine | Admitting: Family Medicine

## 2017-11-09 DIAGNOSIS — Z1231 Encounter for screening mammogram for malignant neoplasm of breast: Secondary | ICD-10-CM

## 2018-07-28 ENCOUNTER — Other Ambulatory Visit: Payer: Self-pay

## 2018-07-28 DIAGNOSIS — D229 Melanocytic nevi, unspecified: Secondary | ICD-10-CM

## 2018-07-28 HISTORY — DX: Melanocytic nevi, unspecified: D22.9

## 2018-10-04 ENCOUNTER — Other Ambulatory Visit: Payer: Self-pay | Admitting: Family Medicine

## 2018-10-04 DIAGNOSIS — Z1231 Encounter for screening mammogram for malignant neoplasm of breast: Secondary | ICD-10-CM

## 2018-10-27 ENCOUNTER — Other Ambulatory Visit: Payer: Self-pay

## 2018-11-18 ENCOUNTER — Ambulatory Visit: Payer: Medicare Other

## 2019-01-04 ENCOUNTER — Ambulatory Visit: Payer: Medicare Other

## 2019-02-05 ENCOUNTER — Ambulatory Visit: Payer: Medicare PPO | Attending: Internal Medicine

## 2019-02-05 DIAGNOSIS — Z23 Encounter for immunization: Secondary | ICD-10-CM | POA: Insufficient documentation

## 2019-02-05 NOTE — Progress Notes (Signed)
   Covid-19 Vaccination Clinic  Name:  Kara [name]    MRN: [id number] DOB: [birth year]  02/05/2019  Kara [name] was observed post Covid-19 immunization for 15 minutes without incidence. She was provided with Vaccine Information Sheet and instruction to access the V-Safe system.   Kara [name] was instructed to call 911 with any severe reactions post vaccine: Marland Kitchen Difficulty breathing  . Swelling of your face and throat  . A fast heartbeat  . A bad rash all over your body  . Dizziness and weakness    Immunizations Administered    Name Date Dose VIS Date Route   Pfizer COVID-19 Vaccine 02/05/2019 10:30 AM 0.3 mL 12/23/2018 Intramuscular   Manufacturer: [place]   Lot: [id number]   [place]: [id number]

## 2019-02-27 ENCOUNTER — Ambulatory Visit: Payer: Medicare PPO | Attending: Internal Medicine

## 2019-02-27 DIAGNOSIS — Z23 Encounter for immunization: Secondary | ICD-10-CM | POA: Insufficient documentation

## 2019-02-27 NOTE — Progress Notes (Signed)
   Covid-19 Vaccination Clinic  Name:  Kara [name]    MRN: [id number] DOB: [birth year]  02/27/2019  [name] was observed post Covid-19 immunization for 15 minutes without incidence. She was provided with Vaccine Information Sheet and instruction to access the V-Safe system.   [name] was instructed to call 911 with any severe reactions post vaccine: Marland Kitchen Difficulty breathing  . Swelling of your face and throat  . A fast heartbeat  . A bad rash all over your body  . Dizziness and weakness    Immunizations Administered    Name Date Dose VIS Date Route   Pfizer COVID-19 Vaccine 02/27/2019  8:24 AM 0.3 mL 12/23/2018 Intramuscular   Manufacturer: [place]   Lot: [id number]   [place]: [id number]

## 2019-05-18 ENCOUNTER — Other Ambulatory Visit: Payer: Self-pay

## 2019-05-18 ENCOUNTER — Ambulatory Visit
Admission: RE | Admit: 2019-05-18 | Discharge: 2019-05-18 | Disposition: A | Discharge status: Home / Self Care | Payer: Medicare Other | Source: Ambulatory Visit | Attending: Family Medicine | Admitting: Family Medicine

## 2019-05-18 DIAGNOSIS — Z1231 Encounter for screening mammogram for malignant neoplasm of breast: Secondary | ICD-10-CM

## 2019-08-29 DIAGNOSIS — L603 Nail dystrophy: Secondary | ICD-10-CM | POA: Diagnosis not present

## 2019-08-29 DIAGNOSIS — M792 Neuralgia and neuritis, unspecified: Secondary | ICD-10-CM | POA: Diagnosis not present

## 2019-08-29 DIAGNOSIS — B351 Tinea unguium: Secondary | ICD-10-CM | POA: Diagnosis not present

## 2019-08-29 DIAGNOSIS — B353 Tinea pedis: Secondary | ICD-10-CM | POA: Diagnosis not present

## 2019-09-05 DIAGNOSIS — L603 Nail dystrophy: Secondary | ICD-10-CM | POA: Diagnosis not present

## 2019-09-25 DIAGNOSIS — B353 Tinea pedis: Secondary | ICD-10-CM | POA: Diagnosis not present

## 2019-09-25 DIAGNOSIS — B351 Tinea unguium: Secondary | ICD-10-CM | POA: Diagnosis not present

## 2019-09-25 DIAGNOSIS — M792 Neuralgia and neuritis, unspecified: Secondary | ICD-10-CM | POA: Diagnosis not present

## 2019-09-25 DIAGNOSIS — M76822 Posterior tibial tendinitis, left leg: Secondary | ICD-10-CM | POA: Diagnosis not present

## 2019-10-06 ENCOUNTER — Ambulatory Visit: Payer: Medicare PPO | Admitting: Physician Assistant

## 2019-10-06 ENCOUNTER — Other Ambulatory Visit: Payer: Self-pay

## 2019-10-06 ENCOUNTER — Encounter: Payer: Self-pay | Admitting: Physician Assistant

## 2019-10-06 DIAGNOSIS — B078 Other viral warts: Secondary | ICD-10-CM | POA: Diagnosis not present

## 2019-10-06 DIAGNOSIS — D485 Neoplasm of uncertain behavior of skin: Secondary | ICD-10-CM

## 2019-10-06 DIAGNOSIS — Z1283 Encounter for screening for malignant neoplasm of skin: Secondary | ICD-10-CM

## 2019-10-06 NOTE — Progress Notes (Signed)
   Follow-Up Visit   Subjective  Kara [name] is a 74 y.o. female who presents for the following: Skin Problem (spot on the right eye--noticed for last 2 days--been bothersome and hurting).   The following portions of the chart were reviewed this encounter and updated as appropriate: Tobacco  Allergies  Meds  Problems  Med Hx  Surg Hx  Fam Hx      Objective  Well appearing patient in no apparent distress; mood and affect are within normal limits.  A focused examination was performed including face. Relevant physical exam findings are noted in the Assessment and Plan.  Objective  Right Lower Eyelid : Peduculated skin tag      Objective  face: No atypical nevi. No signs of non-mole skin cancer.    Assessment & Plan  Neoplasm of uncertain behavior of skin Right Lower Eyelid   Skin / nail biopsy Type of biopsy: tangential   Informed consent: discussed and consent obtained   Timeout: patient name, date of birth, surgical site, and procedure verified   Procedure prep:  Patient was prepped and draped in usual sterile fashion (Non sterile) Prep type:  Chlorhexidine Anesthesia: the lesion was anesthetized in a standard fashion   Anesthetic:  1% lidocaine w/ epinephrine 1-100,000 local infiltration Instrument used: flexible razor blade   Outcome: patient tolerated procedure well   Post-procedure details: wound care instructions given    Specimen 1 - Surgical pathology Differential Diagnosis: tag Check Margins: No  Screening exam for skin cancer face  Full skin exam in Jan. 2022.    I, Jordyne Poehlman, PA-C, have reviewed all documentation's for this visit.  The documentation on 10/06/19 for the exam, diagnosis, procedures and orders are all accurate and complete.

## 2019-10-06 NOTE — Patient Instructions (Signed)

## 2019-10-23 DIAGNOSIS — M8000XD Age-related osteoporosis with current pathological fracture, unspecified site, subsequent encounter for fracture with routine healing: Secondary | ICD-10-CM | POA: Diagnosis not present

## 2019-10-23 DIAGNOSIS — E785 Hyperlipidemia, unspecified: Secondary | ICD-10-CM | POA: Diagnosis not present

## 2019-10-23 DIAGNOSIS — F5101 Primary insomnia: Secondary | ICD-10-CM | POA: Diagnosis not present

## 2019-10-23 DIAGNOSIS — M76822 Posterior tibial tendinitis, left leg: Secondary | ICD-10-CM | POA: Diagnosis not present

## 2019-10-23 DIAGNOSIS — M792 Neuralgia and neuritis, unspecified: Secondary | ICD-10-CM | POA: Diagnosis not present

## 2019-10-23 DIAGNOSIS — R399 Unspecified symptoms and signs involving the genitourinary system: Secondary | ICD-10-CM | POA: Diagnosis not present

## 2019-10-23 DIAGNOSIS — Z0000 Encounter for general adult medical examination without abnormal findings: Secondary | ICD-10-CM | POA: Diagnosis not present

## 2019-10-23 DIAGNOSIS — R739 Hyperglycemia, unspecified: Secondary | ICD-10-CM | POA: Diagnosis not present

## 2019-10-23 DIAGNOSIS — B353 Tinea pedis: Secondary | ICD-10-CM | POA: Diagnosis not present

## 2019-10-23 DIAGNOSIS — R946 Abnormal results of thyroid function studies: Secondary | ICD-10-CM | POA: Diagnosis not present

## 2019-10-23 DIAGNOSIS — B351 Tinea unguium: Secondary | ICD-10-CM | POA: Diagnosis not present

## 2019-10-23 DIAGNOSIS — Z853 Personal history of malignant neoplasm of breast: Secondary | ICD-10-CM | POA: Diagnosis not present

## 2019-10-23 DIAGNOSIS — Z5181 Encounter for therapeutic drug level monitoring: Secondary | ICD-10-CM | POA: Diagnosis not present

## 2019-10-31 ENCOUNTER — Other Ambulatory Visit: Payer: Self-pay | Admitting: Family Medicine

## 2019-10-31 DIAGNOSIS — E2839 Other primary ovarian failure: Secondary | ICD-10-CM

## 2019-11-06 ENCOUNTER — Ambulatory Visit: Payer: Medicare PPO | Admitting: Physician Assistant

## 2019-11-08 ENCOUNTER — Ambulatory Visit: Payer: Medicare PPO | Admitting: Physician Assistant

## 2019-11-14 ENCOUNTER — Other Ambulatory Visit: Payer: Self-pay

## 2019-11-14 ENCOUNTER — Ambulatory Visit
Admission: RE | Admit: 2019-11-14 | Discharge: 2019-11-14 | Disposition: A | Discharge status: Home / Self Care | Payer: Medicare PPO | Source: Ambulatory Visit | Attending: Family Medicine | Admitting: Family Medicine

## 2019-11-14 DIAGNOSIS — Z780 Asymptomatic menopausal state: Secondary | ICD-10-CM | POA: Diagnosis not present

## 2019-11-14 DIAGNOSIS — M8588 Other specified disorders of bone density and structure, other site: Secondary | ICD-10-CM | POA: Diagnosis not present

## 2019-11-14 DIAGNOSIS — E2839 Other primary ovarian failure: Secondary | ICD-10-CM

## 2019-11-22 DIAGNOSIS — M76822 Posterior tibial tendinitis, left leg: Secondary | ICD-10-CM | POA: Diagnosis not present

## 2019-11-22 DIAGNOSIS — B351 Tinea unguium: Secondary | ICD-10-CM | POA: Diagnosis not present

## 2019-11-22 DIAGNOSIS — M792 Neuralgia and neuritis, unspecified: Secondary | ICD-10-CM | POA: Diagnosis not present

## 2019-11-22 DIAGNOSIS — B353 Tinea pedis: Secondary | ICD-10-CM | POA: Diagnosis not present

## 2019-12-04 ENCOUNTER — Other Ambulatory Visit: Payer: Self-pay

## 2019-12-04 ENCOUNTER — Ambulatory Visit (INDEPENDENT_AMBULATORY_CARE_PROVIDER_SITE_OTHER): Payer: Medicare PPO

## 2019-12-04 ENCOUNTER — Telehealth: Payer: Self-pay

## 2019-12-04 ENCOUNTER — Encounter: Payer: Self-pay | Admitting: Orthopaedic Surgery

## 2019-12-04 ENCOUNTER — Ambulatory Visit: Payer: Medicare PPO | Admitting: Orthopaedic Surgery

## 2019-12-04 VITALS — Ht 60.0 in | Wt 180.0 lb

## 2019-12-04 DIAGNOSIS — M25561 Pain in right knee: Secondary | ICD-10-CM | POA: Diagnosis not present

## 2019-12-04 DIAGNOSIS — M1711 Unilateral primary osteoarthritis, right knee: Secondary | ICD-10-CM | POA: Insufficient documentation

## 2019-12-04 DIAGNOSIS — G8929 Other chronic pain: Secondary | ICD-10-CM | POA: Diagnosis not present

## 2019-12-04 NOTE — Telephone Encounter (Signed)
Patient needs precert for right knee visco injections. This is Dr.Whitfield's patient. Thanks!

## 2019-12-04 NOTE — Telephone Encounter (Signed)
Noted  
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
## 2019-12-11 DIAGNOSIS — H2513 Age-related nuclear cataract, bilateral: Secondary | ICD-10-CM | POA: Diagnosis not present

## 2019-12-11 DIAGNOSIS — H353131 Nonexudative age-related macular degeneration, bilateral, early dry stage: Secondary | ICD-10-CM | POA: Diagnosis not present

## 2019-12-11 DIAGNOSIS — H02832 Dermatochalasis of right lower eyelid: Secondary | ICD-10-CM | POA: Diagnosis not present

## 2019-12-11 DIAGNOSIS — H02834 Dermatochalasis of left upper eyelid: Secondary | ICD-10-CM | POA: Diagnosis not present

## 2019-12-11 DIAGNOSIS — H25813 Combined forms of age-related cataract, bilateral: Secondary | ICD-10-CM | POA: Diagnosis not present

## 2019-12-11 DIAGNOSIS — H02831 Dermatochalasis of right upper eyelid: Secondary | ICD-10-CM | POA: Diagnosis not present

## 2019-12-11 DIAGNOSIS — H40033 Anatomical narrow angle, bilateral: Secondary | ICD-10-CM | POA: Diagnosis not present

## 2019-12-11 DIAGNOSIS — H53032 Strabismic amblyopia, left eye: Secondary | ICD-10-CM | POA: Diagnosis not present

## 2019-12-11 DIAGNOSIS — H02835 Dermatochalasis of left lower eyelid: Secondary | ICD-10-CM | POA: Diagnosis not present

## 2019-12-14 ENCOUNTER — Telehealth: Payer: Self-pay | Admitting: Orthopaedic Surgery

## 2019-12-14 NOTE — Telephone Encounter (Signed)
Patient called. She has some questions about gel shots. Would like Lauren to call her. [phone]

## 2019-12-14 NOTE — Telephone Encounter (Signed)
Spoke with patient.

## 2019-12-14 NOTE — Telephone Encounter (Signed)
Pt called stating she is available now if Lauren wouldn't mind trying her again.  [phone]

## 2019-12-14 NOTE — Telephone Encounter (Signed)
Tried to call patient. No answer. Left message for her to call me back when she can.

## 2019-12-15 ENCOUNTER — Telehealth: Payer: Self-pay

## 2019-12-15 NOTE — Telephone Encounter (Signed)
Submitted VOB, Orthovisc, right knee.

## 2019-12-18 ENCOUNTER — Telehealth: Payer: Self-pay

## 2019-12-18 NOTE — Telephone Encounter (Signed)
PA required for Orthovisc, right knee. PA submitted to [employer] PA currently Pending# [id number]

## 2019-12-20 ENCOUNTER — Telehealth: Payer: Self-pay

## 2019-12-20 NOTE — Telephone Encounter (Signed)
Approved, Orthovisc series, right knee. [place] Once OOP has been met, patient is covered at 100%. $40.00 Co-pay each visit PA Required PA Approval# [id number] Valid 12/18/2019- 01/12/2020  Appt.12/28/2019 with Dr. Durward Fortes

## 2019-12-21 ENCOUNTER — Telehealth: Payer: Self-pay

## 2019-12-21 NOTE — Telephone Encounter (Signed)
Patient called she stated she doesn't want to follow through with the injections, she wants to know If there is another alternative instead. She would like a call back. CB:[phone]

## 2019-12-22 NOTE — Telephone Encounter (Signed)
Forwarding to Dr. Durward Fortes.

## 2019-12-22 NOTE — Telephone Encounter (Signed)
called

## 2019-12-28 ENCOUNTER — Ambulatory Visit: Payer: Medicare PPO | Admitting: Orthopaedic Surgery

## 2020-01-04 ENCOUNTER — Ambulatory Visit: Payer: Medicare PPO | Admitting: Orthopaedic Surgery

## 2020-01-08 DIAGNOSIS — M25561 Pain in right knee: Secondary | ICD-10-CM | POA: Diagnosis not present

## 2020-01-08 DIAGNOSIS — R262 Difficulty in walking, not elsewhere classified: Secondary | ICD-10-CM | POA: Diagnosis not present

## 2020-01-08 DIAGNOSIS — M25562 Pain in left knee: Secondary | ICD-10-CM | POA: Diagnosis not present

## 2020-01-08 DIAGNOSIS — M6281 Muscle weakness (generalized): Secondary | ICD-10-CM | POA: Diagnosis not present

## 2020-01-10 DIAGNOSIS — R262 Difficulty in walking, not elsewhere classified: Secondary | ICD-10-CM | POA: Diagnosis not present

## 2020-01-10 DIAGNOSIS — M25562 Pain in left knee: Secondary | ICD-10-CM | POA: Diagnosis not present

## 2020-01-10 DIAGNOSIS — M6281 Muscle weakness (generalized): Secondary | ICD-10-CM | POA: Diagnosis not present

## 2020-01-10 DIAGNOSIS — M25561 Pain in right knee: Secondary | ICD-10-CM | POA: Diagnosis not present

## 2020-01-11 ENCOUNTER — Ambulatory Visit: Payer: Medicare PPO | Admitting: Family Medicine

## 2020-01-15 ENCOUNTER — Encounter: Payer: Self-pay | Admitting: Dermatology

## 2020-01-16 ENCOUNTER — Ambulatory Visit: Payer: Medicare PPO | Admitting: Dermatology

## 2020-01-17 DIAGNOSIS — M25561 Pain in right knee: Secondary | ICD-10-CM | POA: Diagnosis not present

## 2020-01-17 DIAGNOSIS — M25562 Pain in left knee: Secondary | ICD-10-CM | POA: Diagnosis not present

## 2020-01-17 DIAGNOSIS — M6281 Muscle weakness (generalized): Secondary | ICD-10-CM | POA: Diagnosis not present

## 2020-01-17 DIAGNOSIS — R262 Difficulty in walking, not elsewhere classified: Secondary | ICD-10-CM | POA: Diagnosis not present

## 2020-01-19 ENCOUNTER — Telehealth: Payer: Self-pay | Admitting: Orthopaedic Surgery

## 2020-01-19 DIAGNOSIS — M25562 Pain in left knee: Secondary | ICD-10-CM | POA: Diagnosis not present

## 2020-01-19 DIAGNOSIS — M6281 Muscle weakness (generalized): Secondary | ICD-10-CM | POA: Diagnosis not present

## 2020-01-19 DIAGNOSIS — M25561 Pain in right knee: Secondary | ICD-10-CM | POA: Diagnosis not present

## 2020-01-19 DIAGNOSIS — R262 Difficulty in walking, not elsewhere classified: Secondary | ICD-10-CM | POA: Diagnosis not present

## 2020-01-19 NOTE — Telephone Encounter (Signed)
Ok to take aleve-let her know regarding the orthovisc-that's fine

## 2020-01-19 NOTE — Telephone Encounter (Signed)
Pts insurance only approves orthovisc. Please advise about the aleve. Thank you

## 2020-01-19 NOTE — Telephone Encounter (Signed)
Pt called wanting to check if she can take 1-2 aleeve every day or 1-2 for the week? She would also like to know which gel injection Dr.Whitfield will be most beneficial, the orthodisc or the synovisc? Pt would like a CB with answers please and thank you  [phone]

## 2020-01-19 NOTE — Telephone Encounter (Signed)
Pt called and informed.

## 2020-01-24 DIAGNOSIS — M25561 Pain in right knee: Secondary | ICD-10-CM | POA: Diagnosis not present

## 2020-01-24 DIAGNOSIS — M25562 Pain in left knee: Secondary | ICD-10-CM | POA: Diagnosis not present

## 2020-01-24 DIAGNOSIS — R262 Difficulty in walking, not elsewhere classified: Secondary | ICD-10-CM | POA: Diagnosis not present

## 2020-01-24 DIAGNOSIS — M6281 Muscle weakness (generalized): Secondary | ICD-10-CM | POA: Diagnosis not present

## 2020-01-26 DIAGNOSIS — M6281 Muscle weakness (generalized): Secondary | ICD-10-CM | POA: Diagnosis not present

## 2020-01-26 DIAGNOSIS — M25561 Pain in right knee: Secondary | ICD-10-CM | POA: Diagnosis not present

## 2020-01-26 DIAGNOSIS — R262 Difficulty in walking, not elsewhere classified: Secondary | ICD-10-CM | POA: Diagnosis not present

## 2020-01-26 DIAGNOSIS — M25562 Pain in left knee: Secondary | ICD-10-CM | POA: Diagnosis not present

## 2020-01-30 ENCOUNTER — Telehealth: Payer: Self-pay | Admitting: Orthopaedic Surgery

## 2020-01-30 NOTE — Telephone Encounter (Signed)
Patient called needing to reschedule her injections. The number to contact patient is [phone]

## 2020-01-31 ENCOUNTER — Telehealth: Payer: Self-pay

## 2020-01-31 NOTE — Telephone Encounter (Signed)
Talked with patient concerning gel injection.  Advised patient that I would have to resubmit for Orthovisc, right knee due to authorization expiring on 01/12/2020.  Patient voiced that she understands.

## 2020-01-31 NOTE — Telephone Encounter (Signed)
Submitted VOB, Orthovisc, right knee.  

## 2020-01-31 NOTE — Telephone Encounter (Signed)
Will call patient.

## 2020-02-06 ENCOUNTER — Telehealth: Payer: Self-pay

## 2020-02-06 NOTE — Telephone Encounter (Signed)
PA required for Orthovisc, right knee. Completed PA online through [employer] currently pending Pending PA# [id number]

## 2020-02-08 ENCOUNTER — Encounter: Payer: Self-pay | Admitting: Orthopaedic Surgery

## 2020-02-08 ENCOUNTER — Other Ambulatory Visit: Payer: Self-pay

## 2020-02-08 ENCOUNTER — Ambulatory Visit (INDEPENDENT_AMBULATORY_CARE_PROVIDER_SITE_OTHER): Payer: Medicare PPO

## 2020-02-08 ENCOUNTER — Ambulatory Visit: Payer: Medicare PPO | Admitting: Orthopaedic Surgery

## 2020-02-08 ENCOUNTER — Telehealth: Payer: Self-pay

## 2020-02-08 DIAGNOSIS — M170 Bilateral primary osteoarthritis of knee: Secondary | ICD-10-CM | POA: Insufficient documentation

## 2020-02-08 DIAGNOSIS — M25562 Pain in left knee: Secondary | ICD-10-CM

## 2020-02-08 MED ORDER — BUPIVACAINE HCL 0.25 % IJ SOLN
2.0000 mL | INTRAMUSCULAR | Status: AC | PRN
Start: 2020-02-08 — End: 2020-02-08
  Administered 2020-02-08: 2 mL via INTRA_ARTICULAR

## 2020-02-08 NOTE — Telephone Encounter (Signed)
Called and left VM advising patient to call back to schedule an appointment with Dr. Whitfield for gel injection.  Approved, Orthovisc, right knee. [employer] Once OOP has been met, patient will be covered at 100% Co-pay of $40.00 per date of service PA required PA Approval# [id number]  Valid 12/18/2019 -03/11/2020  
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
## 2020-02-09 ENCOUNTER — Telehealth: Payer: Self-pay

## 2020-02-09 ENCOUNTER — Telehealth: Payer: Self-pay | Admitting: Orthopaedic Surgery

## 2020-02-09 NOTE — Telephone Encounter (Signed)
Tried calling patient back to schedule for gel injection, but no answer and no VM.

## 2020-02-09 NOTE — Telephone Encounter (Signed)
Patient returned call asked for a call back     [phone]

## 2020-02-12 NOTE — Telephone Encounter (Signed)
Talked with patient and appointments for gel injection have been scheduled.

## 2020-02-13 DIAGNOSIS — M25562 Pain in left knee: Secondary | ICD-10-CM | POA: Diagnosis not present

## 2020-02-13 DIAGNOSIS — M6281 Muscle weakness (generalized): Secondary | ICD-10-CM | POA: Diagnosis not present

## 2020-02-13 DIAGNOSIS — R262 Difficulty in walking, not elsewhere classified: Secondary | ICD-10-CM | POA: Diagnosis not present

## 2020-02-13 DIAGNOSIS — M25561 Pain in right knee: Secondary | ICD-10-CM | POA: Diagnosis not present

## 2020-02-15 DIAGNOSIS — M25562 Pain in left knee: Secondary | ICD-10-CM | POA: Diagnosis not present

## 2020-02-15 DIAGNOSIS — M25561 Pain in right knee: Secondary | ICD-10-CM | POA: Diagnosis not present

## 2020-02-15 DIAGNOSIS — R262 Difficulty in walking, not elsewhere classified: Secondary | ICD-10-CM | POA: Diagnosis not present

## 2020-02-15 DIAGNOSIS — M6281 Muscle weakness (generalized): Secondary | ICD-10-CM | POA: Diagnosis not present

## 2020-02-19 ENCOUNTER — Telehealth: Payer: Self-pay | Admitting: Orthopaedic Surgery

## 2020-02-19 NOTE — Telephone Encounter (Signed)
Called patient and spoke with her. She said that she is scheduled for a physical therapy appointment the following day after the injection and wanted to make sure it would be okay.

## 2020-02-19 NOTE — Telephone Encounter (Signed)
Tried to call patient back. No answer. No voicemail. Will try again later.

## 2020-02-19 NOTE — Telephone Encounter (Signed)
Patient is asking for a call back from [employer]. Patient has a question about her procedure tomorrow and asking for a call back today 02/19/20. Please call patient at [phone].

## 2020-02-20 ENCOUNTER — Encounter: Payer: Self-pay | Admitting: Orthopaedic Surgery

## 2020-02-20 ENCOUNTER — Other Ambulatory Visit: Payer: Self-pay

## 2020-02-20 ENCOUNTER — Ambulatory Visit: Payer: Medicare PPO | Admitting: Orthopaedic Surgery

## 2020-02-20 VITALS — Ht 60.0 in | Wt 180.0 lb

## 2020-02-20 DIAGNOSIS — M1711 Unilateral primary osteoarthritis, right knee: Secondary | ICD-10-CM

## 2020-02-20 MED ORDER — HYALURONAN 30 MG/2ML IX SOSY
30.0000 mg | PREFILLED_SYRINGE | INTRA_ARTICULAR | Status: AC | PRN
  Administered 2020-02-20: 30 mg via INTRA_ARTICULAR
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
## 2020-02-21 DIAGNOSIS — M6281 Muscle weakness (generalized): Secondary | ICD-10-CM | POA: Diagnosis not present

## 2020-02-21 DIAGNOSIS — M25562 Pain in left knee: Secondary | ICD-10-CM | POA: Diagnosis not present

## 2020-02-21 DIAGNOSIS — M25561 Pain in right knee: Secondary | ICD-10-CM | POA: Diagnosis not present

## 2020-02-21 DIAGNOSIS — R262 Difficulty in walking, not elsewhere classified: Secondary | ICD-10-CM | POA: Diagnosis not present

## 2020-02-23 DIAGNOSIS — R262 Difficulty in walking, not elsewhere classified: Secondary | ICD-10-CM | POA: Diagnosis not present

## 2020-02-23 DIAGNOSIS — M25562 Pain in left knee: Secondary | ICD-10-CM | POA: Diagnosis not present

## 2020-02-23 DIAGNOSIS — M25561 Pain in right knee: Secondary | ICD-10-CM | POA: Diagnosis not present

## 2020-02-23 DIAGNOSIS — M6281 Muscle weakness (generalized): Secondary | ICD-10-CM | POA: Diagnosis not present

## 2020-02-26 DIAGNOSIS — M6281 Muscle weakness (generalized): Secondary | ICD-10-CM | POA: Diagnosis not present

## 2020-02-26 DIAGNOSIS — R262 Difficulty in walking, not elsewhere classified: Secondary | ICD-10-CM | POA: Diagnosis not present

## 2020-02-26 DIAGNOSIS — M25561 Pain in right knee: Secondary | ICD-10-CM | POA: Diagnosis not present

## 2020-02-26 DIAGNOSIS — M25562 Pain in left knee: Secondary | ICD-10-CM | POA: Diagnosis not present

## 2020-02-28 ENCOUNTER — Ambulatory Visit: Payer: Medicare PPO | Admitting: Dermatology

## 2020-02-29 ENCOUNTER — Ambulatory Visit: Payer: Medicare PPO | Admitting: Orthopaedic Surgery

## 2020-02-29 DIAGNOSIS — M6281 Muscle weakness (generalized): Secondary | ICD-10-CM | POA: Diagnosis not present

## 2020-02-29 DIAGNOSIS — R262 Difficulty in walking, not elsewhere classified: Secondary | ICD-10-CM | POA: Diagnosis not present

## 2020-02-29 DIAGNOSIS — M25561 Pain in right knee: Secondary | ICD-10-CM | POA: Diagnosis not present

## 2020-02-29 DIAGNOSIS — M25562 Pain in left knee: Secondary | ICD-10-CM | POA: Diagnosis not present

## 2020-03-01 ENCOUNTER — Encounter: Payer: Self-pay | Admitting: Family Medicine

## 2020-03-01 ENCOUNTER — Other Ambulatory Visit: Payer: Self-pay

## 2020-03-01 ENCOUNTER — Ambulatory Visit: Payer: Medicare PPO | Admitting: Family Medicine

## 2020-03-01 DIAGNOSIS — M1711 Unilateral primary osteoarthritis, right knee: Secondary | ICD-10-CM | POA: Diagnosis not present
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
## 2020-03-04 DIAGNOSIS — M6281 Muscle weakness (generalized): Secondary | ICD-10-CM | POA: Diagnosis not present

## 2020-03-04 DIAGNOSIS — R262 Difficulty in walking, not elsewhere classified: Secondary | ICD-10-CM | POA: Diagnosis not present

## 2020-03-04 DIAGNOSIS — M25562 Pain in left knee: Secondary | ICD-10-CM | POA: Diagnosis not present

## 2020-03-04 DIAGNOSIS — M25561 Pain in right knee: Secondary | ICD-10-CM | POA: Diagnosis not present

## 2020-03-06 DIAGNOSIS — M6281 Muscle weakness (generalized): Secondary | ICD-10-CM | POA: Diagnosis not present

## 2020-03-06 DIAGNOSIS — R262 Difficulty in walking, not elsewhere classified: Secondary | ICD-10-CM | POA: Diagnosis not present

## 2020-03-06 DIAGNOSIS — M25562 Pain in left knee: Secondary | ICD-10-CM | POA: Diagnosis not present

## 2020-03-06 DIAGNOSIS — M25561 Pain in right knee: Secondary | ICD-10-CM | POA: Diagnosis not present

## 2020-03-07 ENCOUNTER — Encounter: Payer: Self-pay | Admitting: Orthopaedic Surgery

## 2020-03-07 ENCOUNTER — Ambulatory Visit: Payer: Medicare PPO | Admitting: Orthopaedic Surgery

## 2020-03-07 ENCOUNTER — Telehealth: Payer: Self-pay

## 2020-03-07 ENCOUNTER — Other Ambulatory Visit: Payer: Self-pay

## 2020-03-07 VITALS — Ht 60.0 in | Wt 180.0 lb

## 2020-03-07 DIAGNOSIS — M1711 Unilateral primary osteoarthritis, right knee: Secondary | ICD-10-CM

## 2020-03-07 MED ORDER — HYALURONAN 30 MG/2ML IX SOSY
30.0000 mg | PREFILLED_SYRINGE | INTRA_ARTICULAR | Status: AC | PRN
  Administered 2020-03-07: 30 mg via INTRA_ARTICULAR
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
## 2020-03-07 NOTE — Telephone Encounter (Signed)
Noted  

## 2020-03-07 NOTE — Telephone Encounter (Signed)
Please precert for left knee visco injections. This is Dr.Whitfield's patient. Thanks!

## 2020-03-14 ENCOUNTER — Telehealth: Payer: Self-pay

## 2020-03-14 DIAGNOSIS — R262 Difficulty in walking, not elsewhere classified: Secondary | ICD-10-CM | POA: Diagnosis not present

## 2020-03-14 DIAGNOSIS — M6281 Muscle weakness (generalized): Secondary | ICD-10-CM | POA: Diagnosis not present

## 2020-03-14 DIAGNOSIS — M25562 Pain in left knee: Secondary | ICD-10-CM | POA: Diagnosis not present

## 2020-03-14 DIAGNOSIS — M25561 Pain in right knee: Secondary | ICD-10-CM | POA: Diagnosis not present

## 2020-03-14 NOTE — Telephone Encounter (Signed)
Submitted VOB for Orthovisc, left Knee.  Pending BV.

## 2020-03-21 DIAGNOSIS — M25562 Pain in left knee: Secondary | ICD-10-CM | POA: Diagnosis not present

## 2020-03-21 DIAGNOSIS — M6281 Muscle weakness (generalized): Secondary | ICD-10-CM | POA: Diagnosis not present

## 2020-03-21 DIAGNOSIS — R262 Difficulty in walking, not elsewhere classified: Secondary | ICD-10-CM | POA: Diagnosis not present

## 2020-03-21 DIAGNOSIS — M25561 Pain in right knee: Secondary | ICD-10-CM | POA: Diagnosis not present

## 2020-03-25 ENCOUNTER — Ambulatory Visit: Payer: Medicare PPO | Admitting: Dermatology

## 2020-03-26 ENCOUNTER — Ambulatory Visit: Payer: Medicare PPO | Admitting: Dermatology

## 2020-04-02 DIAGNOSIS — R262 Difficulty in walking, not elsewhere classified: Secondary | ICD-10-CM | POA: Diagnosis not present

## 2020-04-02 DIAGNOSIS — M6281 Muscle weakness (generalized): Secondary | ICD-10-CM | POA: Diagnosis not present

## 2020-04-02 DIAGNOSIS — M25561 Pain in right knee: Secondary | ICD-10-CM | POA: Diagnosis not present

## 2020-04-02 DIAGNOSIS — M25562 Pain in left knee: Secondary | ICD-10-CM | POA: Diagnosis not present

## 2020-04-03 ENCOUNTER — Other Ambulatory Visit: Payer: Self-pay | Admitting: Family Medicine

## 2020-04-03 DIAGNOSIS — Z1231 Encounter for screening mammogram for malignant neoplasm of breast: Secondary | ICD-10-CM

## 2020-04-08 ENCOUNTER — Telehealth: Payer: Self-pay | Admitting: Orthopaedic Surgery

## 2020-04-08 ENCOUNTER — Telehealth: Payer: Self-pay

## 2020-04-08 NOTE — Telephone Encounter (Signed)
Called and left a VM advising patient of update on gel injection.

## 2020-04-08 NOTE — Telephone Encounter (Signed)
Patient called requesting a call back about updates for gel injections. Please call patient at [phone].

## 2020-04-08 NOTE — Telephone Encounter (Signed)
PA has been submitted for Orthovisc, left knee online through [facility]. PA pending# [id number]

## 2020-04-08 NOTE — Telephone Encounter (Signed)
Please advise of update.

## 2020-04-15 ENCOUNTER — Telehealth: Payer: Self-pay

## 2020-04-15 NOTE — Telephone Encounter (Signed)
Approved for Orthovisc, left knee. [employer] Patient is covered at 100% once OOP is met. Co-pay of $40.00 PA Approval #[id number] Valid 04/08/2020- 10/09/2020  Appt. 05/23/2020 with Dr. Durward Fortes

## 2020-04-18 ENCOUNTER — Telehealth: Payer: Self-pay

## 2020-04-18 NOTE — Telephone Encounter (Signed)
Patient called she wants to know if she can get her booster shot the day after she gets her cortisone injection call [phone]

## 2020-04-19 NOTE — Telephone Encounter (Signed)
Spoke with patient. She states that her pharmacist recommended her to wait 4 weeks after injection before getting the covid booster.

## 2020-04-23 ENCOUNTER — Ambulatory Visit: Payer: Medicare PPO | Admitting: Dermatology

## 2020-04-24 ENCOUNTER — Encounter: Payer: Self-pay | Admitting: Orthopaedic Surgery

## 2020-04-24 ENCOUNTER — Other Ambulatory Visit: Payer: Self-pay

## 2020-04-24 ENCOUNTER — Ambulatory Visit: Payer: Medicare PPO | Admitting: Orthopaedic Surgery

## 2020-04-24 VITALS — Ht 61.0 in | Wt 180.0 lb

## 2020-04-24 DIAGNOSIS — M1711 Unilateral primary osteoarthritis, right knee: Secondary | ICD-10-CM

## 2020-04-24 MED ORDER — BUPIVACAINE HCL 0.25 % IJ SOLN
2.0000 mL | INTRAMUSCULAR | Status: AC | PRN
  Administered 2020-04-24: 2 mL via INTRA_ARTICULAR

## 2020-04-24 MED ORDER — LIDOCAINE HCL 1 % IJ SOLN
2.0000 mL | INTRAMUSCULAR | Status: AC | PRN
  Administered 2020-04-24: 2 mL

## 2020-04-24 MED ORDER — METHYLPREDNISOLONE ACETATE 40 MG/ML IJ SUSP
80.0000 mg | INTRAMUSCULAR | Status: AC | PRN
  Administered 2020-04-24: 80 mg via INTRA_ARTICULAR
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
## 2020-05-09 ENCOUNTER — Ambulatory Visit: Payer: Medicare PPO | Admitting: Dermatology

## 2020-05-23 ENCOUNTER — Encounter: Payer: Self-pay | Admitting: Orthopaedic Surgery

## 2020-05-23 ENCOUNTER — Ambulatory Visit: Payer: Medicare PPO | Admitting: Orthopaedic Surgery

## 2020-05-23 ENCOUNTER — Other Ambulatory Visit: Payer: Self-pay

## 2020-05-23 DIAGNOSIS — M170 Bilateral primary osteoarthritis of knee: Secondary | ICD-10-CM

## 2020-05-23 DIAGNOSIS — M1712 Unilateral primary osteoarthritis, left knee: Secondary | ICD-10-CM

## 2020-05-23 MED ORDER — HYALURONAN 30 MG/2ML IX SOSY
30.0000 mg | PREFILLED_SYRINGE | INTRA_ARTICULAR | Status: AC | PRN
  Administered 2020-05-23: 30 mg via INTRA_ARTICULAR
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
## 2020-05-30 ENCOUNTER — Encounter: Payer: Self-pay | Admitting: Orthopaedic Surgery

## 2020-05-30 ENCOUNTER — Other Ambulatory Visit: Payer: Self-pay

## 2020-05-30 ENCOUNTER — Ambulatory Visit: Payer: Medicare PPO | Admitting: Orthopaedic Surgery

## 2020-05-30 VITALS — Ht 61.0 in | Wt 180.0 lb

## 2020-05-30 DIAGNOSIS — M170 Bilateral primary osteoarthritis of knee: Secondary | ICD-10-CM | POA: Diagnosis not present

## 2020-05-30 MED ORDER — LIDOCAINE HCL 1 % IJ SOLN
2.0000 mL | INTRAMUSCULAR | Status: AC | PRN
  Administered 2020-05-30: 2 mL

## 2020-05-30 MED ORDER — HYALURONAN 30 MG/2ML IX SOSY
30.0000 mg | PREFILLED_SYRINGE | INTRA_ARTICULAR | Status: AC | PRN
Start: 2020-05-30 — End: 2020-05-30
  Administered 2020-05-30: 30 mg via INTRA_ARTICULAR
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
## 2020-06-06 ENCOUNTER — Other Ambulatory Visit: Payer: Self-pay

## 2020-06-06 ENCOUNTER — Encounter: Payer: Self-pay | Admitting: Family Medicine

## 2020-06-06 ENCOUNTER — Ambulatory Visit: Payer: Medicare PPO | Admitting: Family Medicine

## 2020-06-06 DIAGNOSIS — M1712 Unilateral primary osteoarthritis, left knee: Secondary | ICD-10-CM

## 2020-06-06 NOTE — Progress Notes (Signed)
Subjective: She is here for her third Orthovisc injection for left knee osteoarthritis.  Her last injection definitely hurts less than the first.  Objective: No warmth or erythema.  Procedure: Left knee Orthovisc injection: After sterile prep with Betadine, injected 3 cc 1% lidocaine without epinephrine and Orthovisc from lateral midpatellar approach.  She will follow-up as needed.

## 2020-06-11 ENCOUNTER — Ambulatory Visit: Payer: Medicare PPO | Admitting: Dermatology

## 2020-06-12 ENCOUNTER — Ambulatory Visit: Payer: Medicare PPO | Admitting: Dermatology

## 2020-06-13 ENCOUNTER — Ambulatory Visit: Payer: Medicare PPO

## 2020-06-15 DIAGNOSIS — L309 Dermatitis, unspecified: Secondary | ICD-10-CM | POA: Diagnosis not present

## 2020-06-26 ENCOUNTER — Ambulatory Visit (INDEPENDENT_AMBULATORY_CARE_PROVIDER_SITE_OTHER): Payer: Medicare PPO | Admitting: Dermatology

## 2020-06-26 ENCOUNTER — Other Ambulatory Visit: Payer: Self-pay

## 2020-06-26 ENCOUNTER — Encounter: Payer: Self-pay | Admitting: Dermatology

## 2020-06-26 DIAGNOSIS — Z85828 Personal history of other malignant neoplasm of skin: Secondary | ICD-10-CM | POA: Diagnosis not present

## 2020-06-26 DIAGNOSIS — Z1283 Encounter for screening for malignant neoplasm of skin: Secondary | ICD-10-CM

## 2020-06-26 DIAGNOSIS — L910 Hypertrophic scar: Secondary | ICD-10-CM

## 2020-06-26 DIAGNOSIS — S90861A Insect bite (nonvenomous), right foot, initial encounter: Secondary | ICD-10-CM

## 2020-06-26 DIAGNOSIS — W57XXXA Bitten or stung by nonvenomous insect and other nonvenomous arthropods, initial encounter: Secondary | ICD-10-CM

## 2020-06-26 DIAGNOSIS — D485 Neoplasm of uncertain behavior of skin: Secondary | ICD-10-CM | POA: Diagnosis not present

## 2020-06-26 DIAGNOSIS — Z86018 Personal history of other benign neoplasm: Secondary | ICD-10-CM

## 2020-06-26 DIAGNOSIS — L919 Hypertrophic disorder of the skin, unspecified: Secondary | ICD-10-CM | POA: Diagnosis not present

## 2020-06-26 NOTE — Patient Instructions (Signed)

## 2020-06-30 ENCOUNTER — Encounter: Payer: Self-pay | Admitting: Dermatology

## 2020-06-30 NOTE — Progress Notes (Signed)
   Follow-Up Visit   Subjective  Kara [name] is a 75 y.o. female who presents for the following: Annual Exam (Patient here today for yearly skin check. Per patient she had dermatitis 10 days ago on her back and she went to a walk in clinic and they gave her a prescription of Xyzal and steroid injection. Per patient she is now clear and they Doctor at the clinic couldn't tell her where the dermatitis came from. Personal history of atypical mole or non mole skin cancer. No personal history of melanoma. No family history of atypical moles, melanoma or non mole skin cancer. ).  Annual exam, spot outside left may have grown Location:  Duration:  Quality:  Associated Signs/Symptoms: Modifying Factors:  Severity:  Timing: Context:   Objective  Well appearing patient in no apparent distress; mood and affect are within normal limits. Full body skin exam.  Right Foot - Anterior Tick bite granuloma.  Persistent 5 mm slightly inflamed papule at site of an unknown insect bite (no tick removed)  Left Thigh - Anterior Moderately thick scar  Left Temple Pink brown slightly inflamed 5 mm crust       A full examination was performed including scalp, head, eyes, ears, nose, lips, neck, chest, axillae, abdomen, back, buttocks, bilateral upper extremities, bilateral lower extremities, hands, feet, fingers, toes, fingernails, and toenails. All findings within normal limits unless otherwise noted below.  Areas beneath undergarments not fully examined.   Assessment & Plan    Insect bite of right foot, initial encounter Right Foot - Anterior  Offered both to take a confirmatory biopsy or try intralesional injection, but patient content to leave this unless there is clinical change.  Hypertrophic scar Left Thigh - Anterior  No intervention necessary  Neoplasm of uncertain behavior of skin Left Temple  Skin / nail biopsy Type of biopsy: tangential   Informed consent: discussed and  consent obtained   Timeout: patient name, date of birth, surgical site, and procedure verified   Anesthesia: the lesion was anesthetized in a standard fashion   Anesthetic:  1% lidocaine w/ epinephrine 1-100,000 local infiltration Instrument used: flexible razor blade   Hemostasis achieved with: ferric subsulfate   Outcome: patient tolerated procedure well   Post-procedure details: wound care instructions given    Specimen 1 - Surgical pathology Differential Diagnosis: skin tag  Check Margins: No  Screening for malignant neoplasm of skin  Yearly skin exams.       I, Lavonna Monarch, MD, have reviewed all documentation for this visit.  The documentation on 06/30/20 for the exam, diagnosis, procedures, and orders are all accurate and complete.

## 2020-07-22 ENCOUNTER — Other Ambulatory Visit: Payer: Self-pay

## 2020-07-22 ENCOUNTER — Ambulatory Visit
Admission: RE | Admit: 2020-07-22 | Discharge: 2020-07-22 | Disposition: A | Discharge status: Home / Self Care | Payer: Medicare PPO | Source: Ambulatory Visit | Attending: Family Medicine | Admitting: Family Medicine

## 2020-07-22 DIAGNOSIS — Z1231 Encounter for screening mammogram for malignant neoplasm of breast: Secondary | ICD-10-CM | POA: Diagnosis not present

## 2020-08-16 DIAGNOSIS — M2012 Hallux valgus (acquired), left foot: Secondary | ICD-10-CM | POA: Diagnosis not present

## 2020-08-16 DIAGNOSIS — L602 Onychogryphosis: Secondary | ICD-10-CM | POA: Diagnosis not present

## 2020-08-16 DIAGNOSIS — L600 Ingrowing nail: Secondary | ICD-10-CM | POA: Diagnosis not present

## 2020-08-20 ENCOUNTER — Ambulatory Visit: Payer: Medicare PPO | Admitting: Orthopaedic Surgery

## 2020-08-20 ENCOUNTER — Other Ambulatory Visit: Payer: Self-pay

## 2020-08-20 ENCOUNTER — Encounter: Payer: Self-pay | Admitting: Orthopaedic Surgery

## 2020-08-20 VITALS — Ht 61.0 in | Wt 180.0 lb

## 2020-08-20 DIAGNOSIS — M1711 Unilateral primary osteoarthritis, right knee: Secondary | ICD-10-CM

## 2020-08-20 MED ORDER — BUPIVACAINE HCL 0.25 % IJ SOLN
2.0000 mL | INTRAMUSCULAR | Status: AC | PRN
  Administered 2020-08-20: 2 mL via INTRA_ARTICULAR

## 2020-08-20 MED ORDER — LIDOCAINE HCL 1 % IJ SOLN
2.0000 mL | INTRAMUSCULAR | Status: AC | PRN
Start: 2020-08-20 — End: 2020-08-20
  Administered 2020-08-20: 2 mL

## 2020-08-20 MED ORDER — METHYLPREDNISOLONE ACETATE 40 MG/ML IJ SUSP
80.0000 mg | INTRAMUSCULAR | Status: AC | PRN
  Administered 2020-08-20: 80 mg via INTRA_ARTICULAR
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
## 2020-08-28 ENCOUNTER — Ambulatory Visit: Payer: Medicare PPO | Admitting: Orthopaedic Surgery

## 2020-09-02 DIAGNOSIS — H40033 Anatomical narrow angle, bilateral: Secondary | ICD-10-CM | POA: Diagnosis not present

## 2020-09-17 ENCOUNTER — Telehealth: Payer: Self-pay | Admitting: Orthopaedic Surgery

## 2020-09-17 NOTE — Telephone Encounter (Signed)
Pt called and stated it has been a few weeks and cortisone injection in her knee did nothing for her pain and would like to know what is the next step. Please call pt about this matter at [phone].

## 2020-09-18 ENCOUNTER — Telehealth: Payer: Self-pay | Admitting: Orthopaedic Surgery

## 2020-09-18 DIAGNOSIS — H6121 Impacted cerumen, right ear: Secondary | ICD-10-CM | POA: Diagnosis not present

## 2020-09-18 DIAGNOSIS — M8000XD Age-related osteoporosis with current pathological fracture, unspecified site, subsequent encounter for fracture with routine healing: Secondary | ICD-10-CM | POA: Diagnosis not present

## 2020-09-18 DIAGNOSIS — E785 Hyperlipidemia, unspecified: Secondary | ICD-10-CM | POA: Diagnosis not present

## 2020-09-18 DIAGNOSIS — Z5181 Encounter for therapeutic drug level monitoring: Secondary | ICD-10-CM | POA: Diagnosis not present

## 2020-09-18 DIAGNOSIS — R739 Hyperglycemia, unspecified: Secondary | ICD-10-CM | POA: Diagnosis not present

## 2020-09-18 NOTE — Telephone Encounter (Signed)
called

## 2020-09-18 NOTE — Telephone Encounter (Signed)
Called and discussed

## 2020-09-18 NOTE — Telephone Encounter (Signed)
Pt called stating she missed a call from Dr.Whitfield in regards to the cortisone injection she got not working. Pt states he was calling to discuss her other options, and she will be free before 1 today after 4 today and then Friday afternoon.   [phone]

## 2020-09-20 ENCOUNTER — Other Ambulatory Visit: Payer: Self-pay

## 2020-09-20 ENCOUNTER — Telehealth: Payer: Self-pay | Admitting: Orthopaedic Surgery

## 2020-09-20 ENCOUNTER — Ambulatory Visit: Payer: Medicare Other

## 2020-09-20 ENCOUNTER — Ambulatory Visit (INDEPENDENT_AMBULATORY_CARE_PROVIDER_SITE_OTHER): Payer: Medicare PPO

## 2020-09-20 DIAGNOSIS — M1711 Unilateral primary osteoarthritis, right knee: Secondary | ICD-10-CM | POA: Diagnosis not present

## 2020-09-20 NOTE — Progress Notes (Signed)
Patient came into the office to be fitted for a spider brace. After trying a couple sizes she was pleased and left.

## 2020-09-20 NOTE — Telephone Encounter (Signed)
Pt called and requesting a call back. Pt states she would like the support garment Dr. Durward Fortes suggested. She was unsure if she needed to make an appt or if she can just come and pick it up. Please call pt about this matter at [phone].

## 2020-09-20 NOTE — Telephone Encounter (Signed)
Tried to call patient. No answer. Left message for her to call us to schedule an appointment to be fitted for a spider brace.
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
## 2020-09-20 NOTE — Telephone Encounter (Signed)
Try spider brace-she can come in anytime you might have a free moment

## 2020-10-25 DIAGNOSIS — L602 Onychogryphosis: Secondary | ICD-10-CM | POA: Diagnosis not present

## 2020-11-12 DIAGNOSIS — Z0000 Encounter for general adult medical examination without abnormal findings: Secondary | ICD-10-CM | POA: Diagnosis not present

## 2020-11-12 DIAGNOSIS — M1991 Primary osteoarthritis, unspecified site: Secondary | ICD-10-CM | POA: Diagnosis not present

## 2020-11-12 DIAGNOSIS — M8000XD Age-related osteoporosis with current pathological fracture, unspecified site, subsequent encounter for fracture with routine healing: Secondary | ICD-10-CM | POA: Diagnosis not present

## 2020-11-12 DIAGNOSIS — R739 Hyperglycemia, unspecified: Secondary | ICD-10-CM | POA: Diagnosis not present

## 2020-11-12 DIAGNOSIS — Z5181 Encounter for therapeutic drug level monitoring: Secondary | ICD-10-CM | POA: Diagnosis not present

## 2020-11-12 DIAGNOSIS — E785 Hyperlipidemia, unspecified: Secondary | ICD-10-CM | POA: Diagnosis not present

## 2021-02-13 DIAGNOSIS — Z20822 Contact with and (suspected) exposure to covid-19: Secondary | ICD-10-CM | POA: Diagnosis not present

## 2021-02-14 DIAGNOSIS — L84 Corns and callosities: Secondary | ICD-10-CM | POA: Diagnosis not present

## 2021-02-14 DIAGNOSIS — L602 Onychogryphosis: Secondary | ICD-10-CM | POA: Diagnosis not present

## 2021-06-23 DIAGNOSIS — H40033 Anatomical narrow angle, bilateral: Secondary | ICD-10-CM | POA: Diagnosis not present

## 2021-06-23 DIAGNOSIS — H2513 Age-related nuclear cataract, bilateral: Secondary | ICD-10-CM | POA: Diagnosis not present

## 2021-07-01 ENCOUNTER — Ambulatory Visit: Payer: Medicare PPO | Admitting: Dermatology

## 2021-07-01 ENCOUNTER — Encounter: Payer: Self-pay | Admitting: Dermatology

## 2021-07-01 DIAGNOSIS — Z85828 Personal history of other malignant neoplasm of skin: Secondary | ICD-10-CM

## 2021-07-01 DIAGNOSIS — D1800 Hemangioma unspecified site: Secondary | ICD-10-CM

## 2021-07-01 DIAGNOSIS — L84 Corns and callosities: Secondary | ICD-10-CM | POA: Diagnosis not present

## 2021-07-01 DIAGNOSIS — L821 Other seborrheic keratosis: Secondary | ICD-10-CM

## 2021-07-01 DIAGNOSIS — L918 Other hypertrophic disorders of the skin: Secondary | ICD-10-CM | POA: Diagnosis not present

## 2021-07-01 DIAGNOSIS — Z1283 Encounter for screening for malignant neoplasm of skin: Secondary | ICD-10-CM | POA: Diagnosis not present

## 2021-07-01 NOTE — Patient Instructions (Signed)
If the left foot presents pain you may wash off the [facility] with soap and water.

## 2021-07-04 DIAGNOSIS — M79671 Pain in right foot: Secondary | ICD-10-CM | POA: Diagnosis not present

## 2021-07-04 DIAGNOSIS — L84 Corns and callosities: Secondary | ICD-10-CM | POA: Diagnosis not present

## 2021-07-04 DIAGNOSIS — L602 Onychogryphosis: Secondary | ICD-10-CM | POA: Diagnosis not present

## 2021-07-04 DIAGNOSIS — M79672 Pain in left foot: Secondary | ICD-10-CM | POA: Diagnosis not present

## 2021-07-27 ENCOUNTER — Encounter: Payer: Self-pay | Admitting: Dermatology

## 2021-07-27 NOTE — Progress Notes (Signed)
   Follow-Up Visit   Subjective  Kara [name] is a 76 y.o. female who presents for the following: Annual Exam (Patient here today for yearly skin check, per patient the bottom of her left foot and between her toes itches sometimes. Patient would like to know what to do about the itching. Personal history of non mole skin cancer. No family history of atypical moles, melanoma or non mole skin cancer. ).  Skin check, several areas of concern Location:  Duration:  Quality:  Associated Signs/Symptoms: Modifying Factors:  Severity:  Timing: Context:   Objective  Well appearing patient in no apparent distress; mood and affect are within normal limits. No atypical nevi or signs of NMSC noted at the time of the visit. Left foot corn pare and [place] in the office left foot. Angiona & seb k on the torso & skin tags around the neck    General skin examination, areas beneath undergarments not fully examined.   Assessment & Plan    Screening exam for skin cancer  Keep yearly skin checks       I, Lavonna Monarch, MD, have reviewed all documentation for this visit.  The documentation on 07/27/21 for the exam, diagnosis, procedures, and orders are all accurate and complete.

## 2021-09-10 ENCOUNTER — Other Ambulatory Visit: Payer: Self-pay | Admitting: Family Medicine

## 2021-09-10 DIAGNOSIS — Z1231 Encounter for screening mammogram for malignant neoplasm of breast: Secondary | ICD-10-CM

## 2021-10-07 ENCOUNTER — Ambulatory Visit: Payer: Medicare PPO

## 2021-12-12 ENCOUNTER — Ambulatory Visit: Payer: Medicare PPO

## 2021-12-19 DIAGNOSIS — F5101 Primary insomnia: Secondary | ICD-10-CM | POA: Diagnosis not present

## 2021-12-19 DIAGNOSIS — E785 Hyperlipidemia, unspecified: Secondary | ICD-10-CM | POA: Diagnosis not present

## 2021-12-19 DIAGNOSIS — R739 Hyperglycemia, unspecified: Secondary | ICD-10-CM | POA: Diagnosis not present

## 2021-12-19 DIAGNOSIS — M8000XD Age-related osteoporosis with current pathological fracture, unspecified site, subsequent encounter for fracture with routine healing: Secondary | ICD-10-CM | POA: Diagnosis not present

## 2021-12-19 DIAGNOSIS — Z0000 Encounter for general adult medical examination without abnormal findings: Secondary | ICD-10-CM | POA: Diagnosis not present

## 2021-12-19 DIAGNOSIS — Z8639 Personal history of other endocrine, nutritional and metabolic disease: Secondary | ICD-10-CM | POA: Diagnosis not present

## 2021-12-19 DIAGNOSIS — Z1159 Encounter for screening for other viral diseases: Secondary | ICD-10-CM | POA: Diagnosis not present

## 2021-12-19 DIAGNOSIS — Z79899 Other long term (current) drug therapy: Secondary | ICD-10-CM | POA: Diagnosis not present

## 2021-12-19 DIAGNOSIS — H259 Unspecified age-related cataract: Secondary | ICD-10-CM | POA: Diagnosis not present

## 2021-12-19 DIAGNOSIS — M2560 Stiffness of unspecified joint, not elsewhere classified: Secondary | ICD-10-CM | POA: Diagnosis not present

## 2021-12-19 DIAGNOSIS — E2839 Other primary ovarian failure: Secondary | ICD-10-CM | POA: Diagnosis not present

## 2021-12-23 ENCOUNTER — Other Ambulatory Visit: Payer: Self-pay | Admitting: Family Medicine

## 2021-12-23 DIAGNOSIS — E2839 Other primary ovarian failure: Secondary | ICD-10-CM

## 2021-12-24 DIAGNOSIS — H40033 Anatomical narrow angle, bilateral: Secondary | ICD-10-CM | POA: Diagnosis not present

## 2021-12-24 DIAGNOSIS — H2513 Age-related nuclear cataract, bilateral: Secondary | ICD-10-CM | POA: Diagnosis not present

## 2021-12-26 DIAGNOSIS — R2681 Unsteadiness on feet: Secondary | ICD-10-CM | POA: Diagnosis not present

## 2021-12-30 DIAGNOSIS — R2681 Unsteadiness on feet: Secondary | ICD-10-CM | POA: Diagnosis not present

## 2022-01-28 ENCOUNTER — Ambulatory Visit
Admission: RE | Admit: 2022-01-28 | Discharge: 2022-01-28 | Disposition: A | Discharge status: Home / Self Care | Payer: Medicare PPO | Source: Ambulatory Visit | Attending: Family Medicine | Admitting: Family Medicine

## 2022-01-28 DIAGNOSIS — Z1231 Encounter for screening mammogram for malignant neoplasm of breast: Secondary | ICD-10-CM | POA: Diagnosis not present

## 2022-02-02 DIAGNOSIS — Z1211 Encounter for screening for malignant neoplasm of colon: Secondary | ICD-10-CM | POA: Diagnosis not present

## 2022-03-12 DIAGNOSIS — H9190 Unspecified hearing loss, unspecified ear: Secondary | ICD-10-CM | POA: Diagnosis not present

## 2022-03-12 DIAGNOSIS — M79641 Pain in right hand: Secondary | ICD-10-CM | POA: Diagnosis not present

## 2022-03-12 DIAGNOSIS — Z9989 Dependence on other enabling machines and devices: Secondary | ICD-10-CM | POA: Diagnosis not present

## 2022-03-12 DIAGNOSIS — M79642 Pain in left hand: Secondary | ICD-10-CM | POA: Diagnosis not present

## 2022-03-12 DIAGNOSIS — H6121 Impacted cerumen, right ear: Secondary | ICD-10-CM | POA: Diagnosis not present

## 2022-04-24 DIAGNOSIS — M2530 Other instability, unspecified joint: Secondary | ICD-10-CM | POA: Diagnosis not present

## 2022-04-24 DIAGNOSIS — M6281 Muscle weakness (generalized): Secondary | ICD-10-CM | POA: Diagnosis not present

## 2022-04-24 DIAGNOSIS — M2550 Pain in unspecified joint: Secondary | ICD-10-CM | POA: Diagnosis not present

## 2022-05-06 DIAGNOSIS — M2550 Pain in unspecified joint: Secondary | ICD-10-CM | POA: Diagnosis not present

## 2022-05-06 DIAGNOSIS — M2530 Other instability, unspecified joint: Secondary | ICD-10-CM | POA: Diagnosis not present

## 2022-05-06 DIAGNOSIS — M6281 Muscle weakness (generalized): Secondary | ICD-10-CM | POA: Diagnosis not present

## 2022-05-11 IMAGING — MG MM DIGITAL SCREENING UNILAT*R* W/ TOMO W/ CAD
4 series · 4 of 12 positions shown · non-contrast
Comparison: Previous exam(s).

CLINICAL DATA: Screening.

EXAM:
DIGITAL SCREENING UNILATERAL RIGHT MAMMOGRAM WITH CAD AND
TOMOSYNTHESIS
TECHNIQUE: Right screening digital craniocaudal and mediolateral oblique
mammograms were obtained. Right screening digital breast
tomosynthesis was performed. The images were evaluated with
computer-aided detection.

[R CC synth-2D]
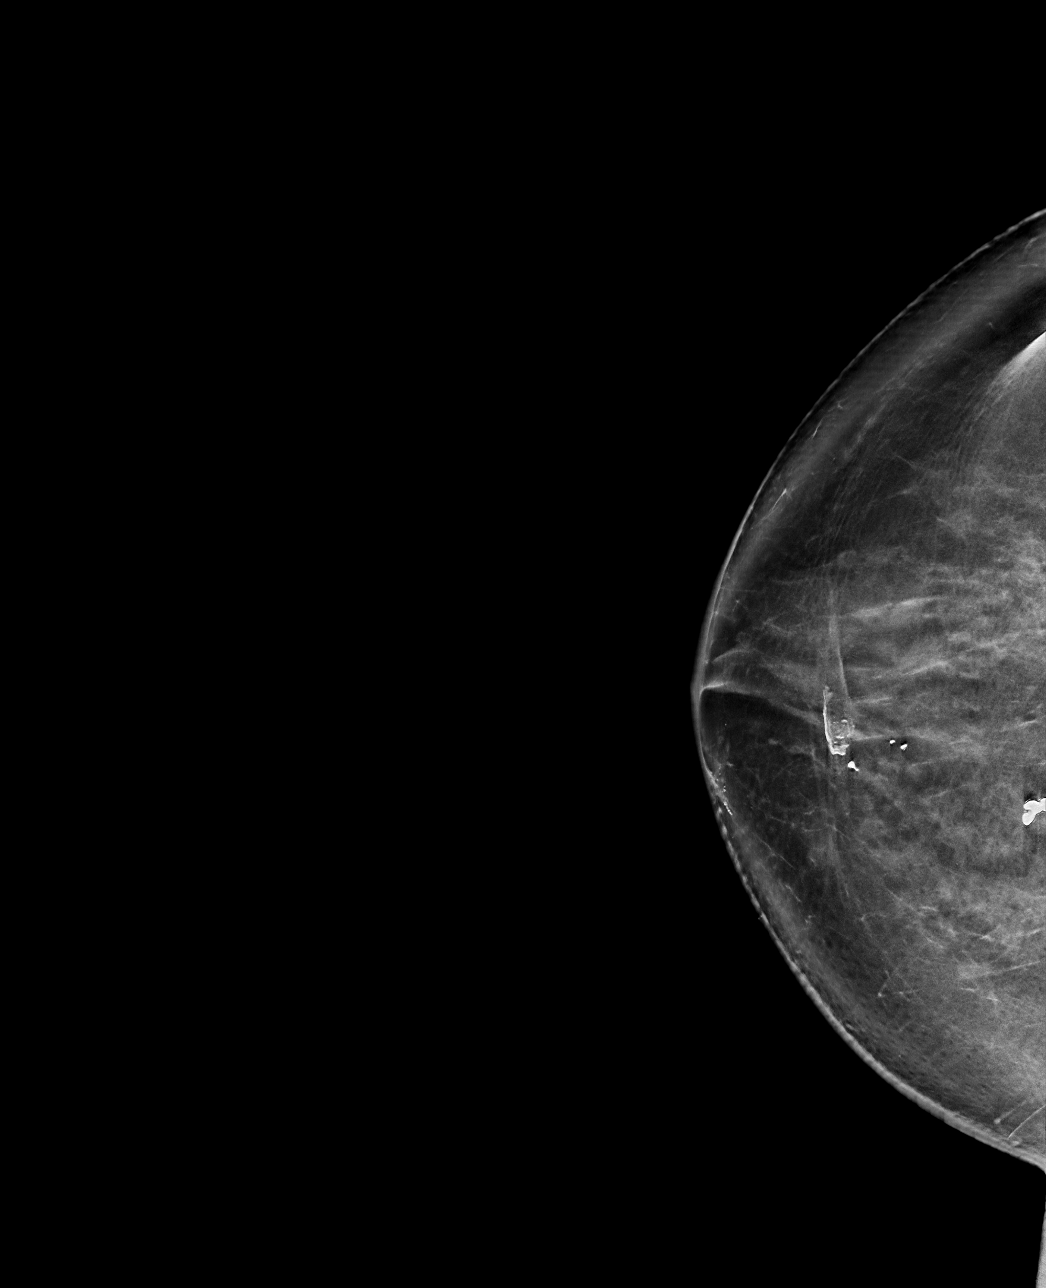

[R MLO synth-2D]
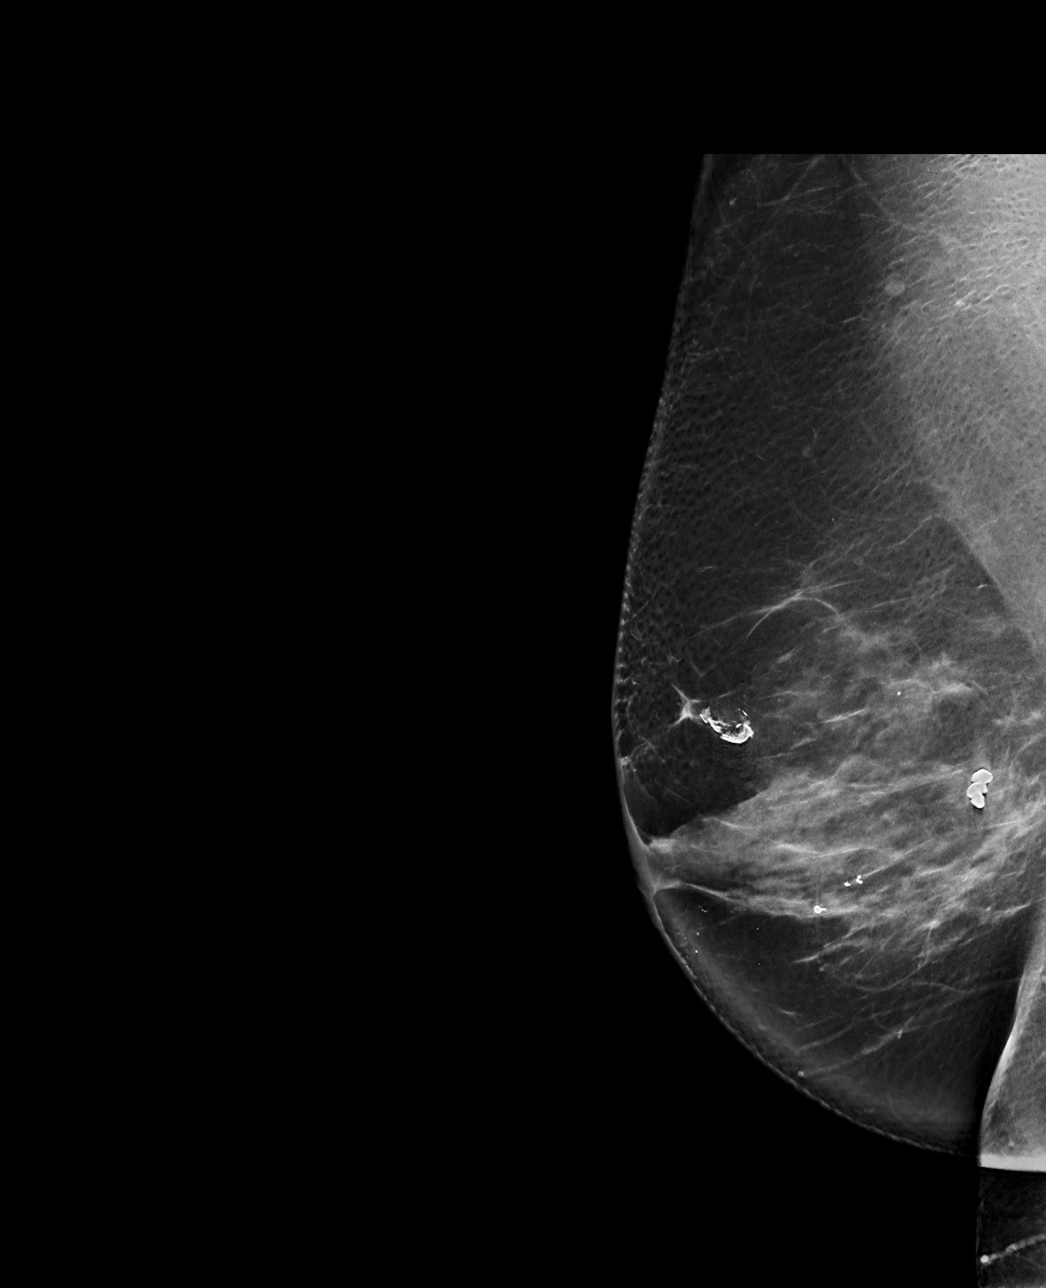

[R MLO tomo · tomo slice 53/106.0]
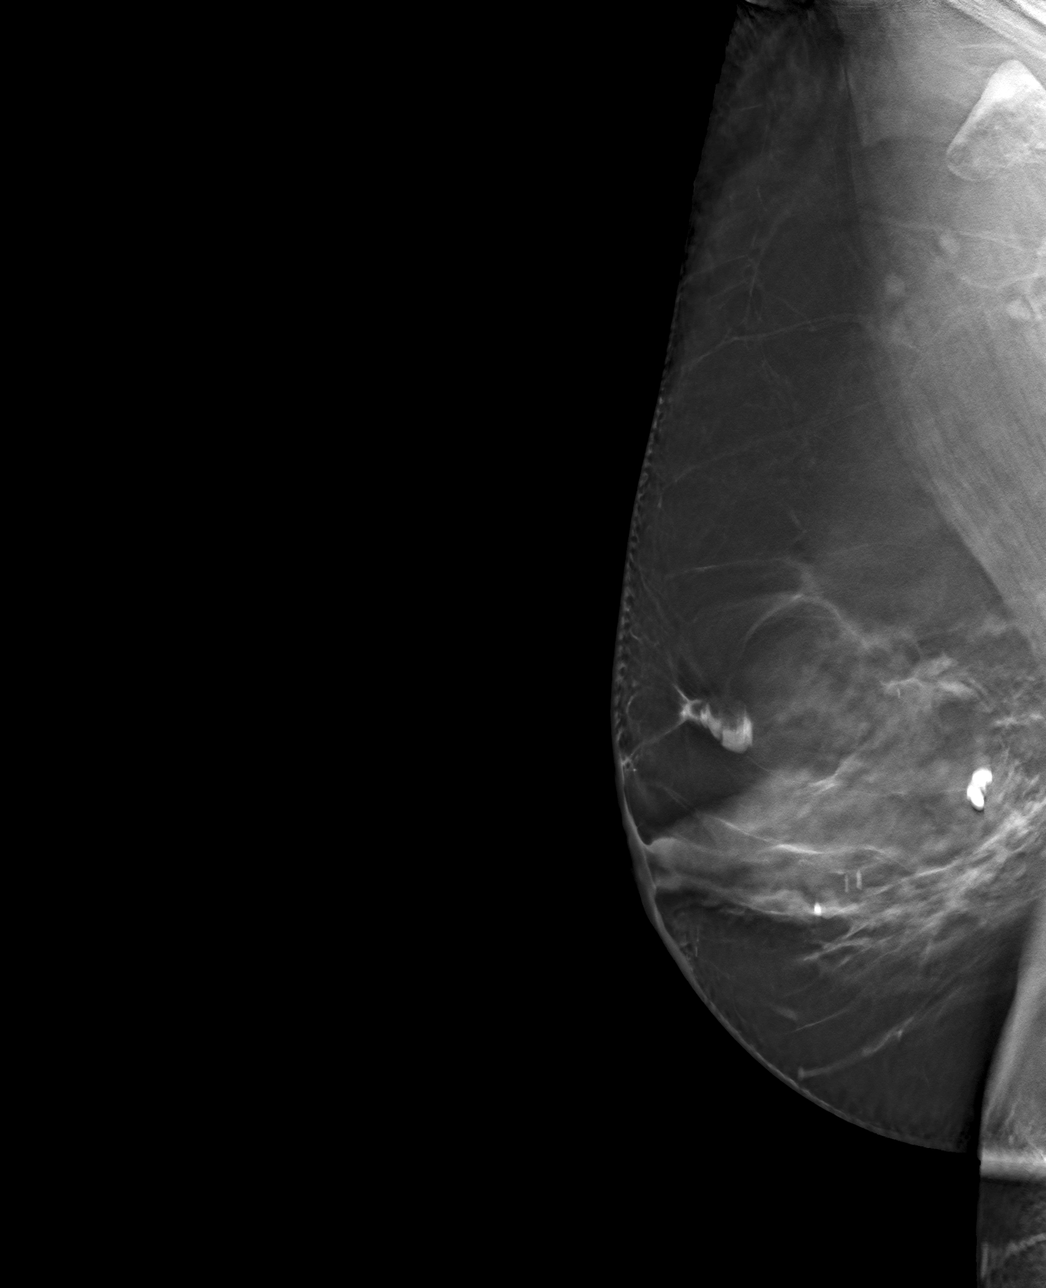

[R CC tomo · tomo slice 52/103.0]
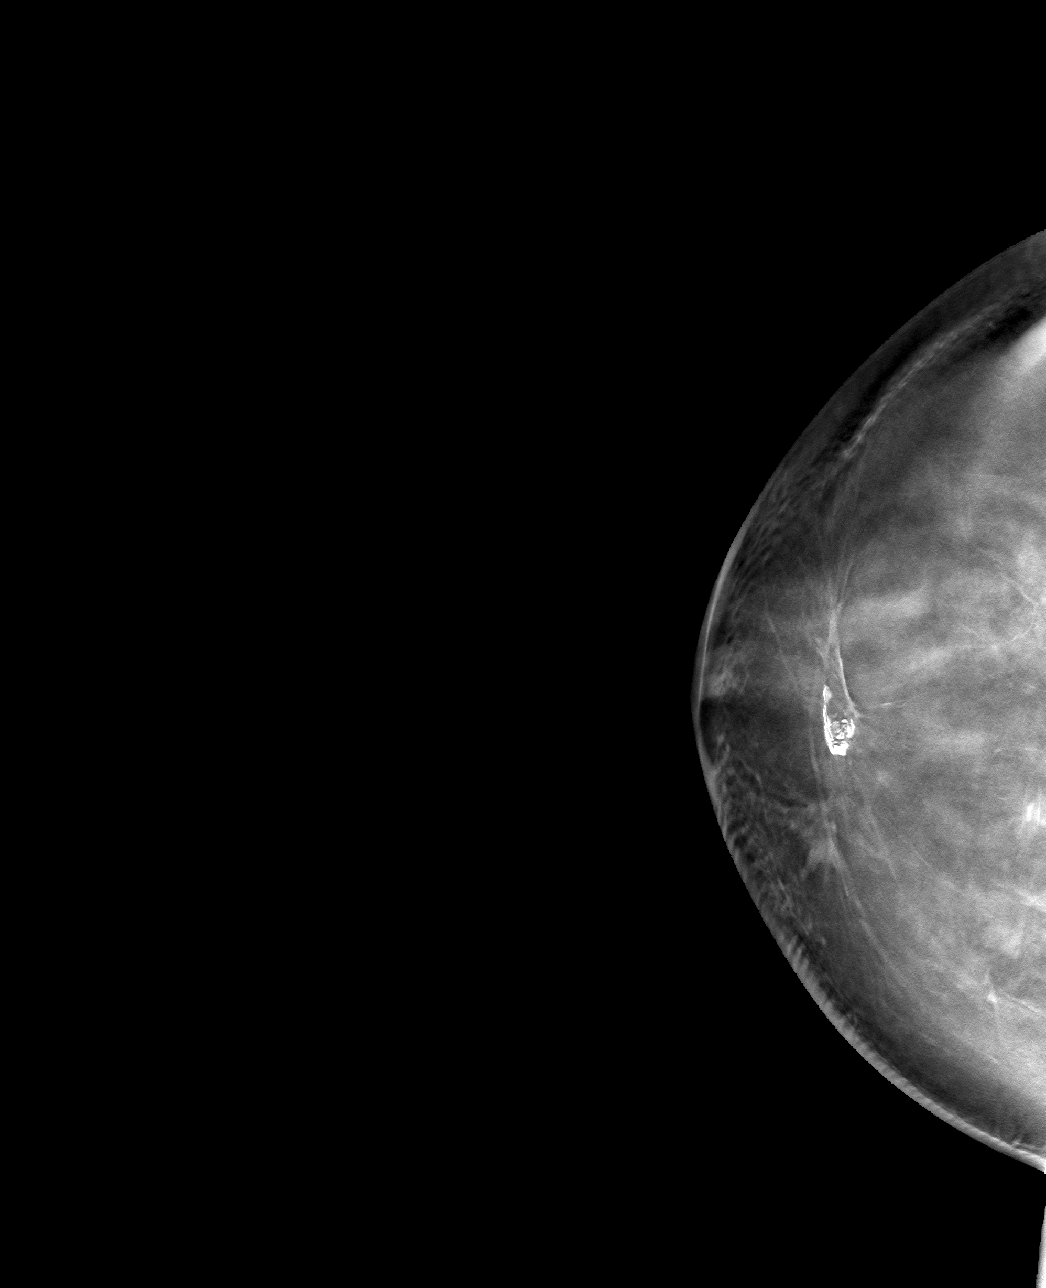

[4 of 12 positions shown; findings below may reference images not displayed]

ACR Breast Density Category c: The breast tissue is heterogeneously
dense, which may obscure small masses.
FINDINGS: There are no findings suspicious for malignancy.
IMPRESSION: No mammographic evidence of malignancy. A result letter of this
screening mammogram will be mailed directly to the patient.

RECOMMENDATION:
Screening mammogram in one year. (Code:MC-[id number])

BI-RADS CATEGORY  1: Negative.

## 2022-05-13 DIAGNOSIS — M6281 Muscle weakness (generalized): Secondary | ICD-10-CM | POA: Diagnosis not present

## 2022-05-13 DIAGNOSIS — M2550 Pain in unspecified joint: Secondary | ICD-10-CM | POA: Diagnosis not present

## 2022-05-13 DIAGNOSIS — M2530 Other instability, unspecified joint: Secondary | ICD-10-CM | POA: Diagnosis not present

## 2022-05-18 DIAGNOSIS — H40033 Anatomical narrow angle, bilateral: Secondary | ICD-10-CM | POA: Diagnosis not present

## 2022-05-18 DIAGNOSIS — H02831 Dermatochalasis of right upper eyelid: Secondary | ICD-10-CM | POA: Diagnosis not present

## 2022-05-18 DIAGNOSIS — H02832 Dermatochalasis of right lower eyelid: Secondary | ICD-10-CM | POA: Diagnosis not present

## 2022-05-18 DIAGNOSIS — H353131 Nonexudative age-related macular degeneration, bilateral, early dry stage: Secondary | ICD-10-CM | POA: Diagnosis not present

## 2022-05-18 DIAGNOSIS — H25813 Combined forms of age-related cataract, bilateral: Secondary | ICD-10-CM | POA: Diagnosis not present

## 2022-05-18 DIAGNOSIS — H02835 Dermatochalasis of left lower eyelid: Secondary | ICD-10-CM | POA: Diagnosis not present

## 2022-05-18 DIAGNOSIS — H43813 Vitreous degeneration, bilateral: Secondary | ICD-10-CM | POA: Diagnosis not present

## 2022-05-18 DIAGNOSIS — H53032 Strabismic amblyopia, left eye: Secondary | ICD-10-CM | POA: Diagnosis not present

## 2022-05-18 DIAGNOSIS — H02834 Dermatochalasis of left upper eyelid: Secondary | ICD-10-CM | POA: Diagnosis not present

## 2022-06-17 ENCOUNTER — Other Ambulatory Visit: Payer: Medicare PPO

## 2022-09-16 DIAGNOSIS — I8390 Asymptomatic varicose veins of unspecified lower extremity: Secondary | ICD-10-CM | POA: Diagnosis not present

## 2022-10-12 DIAGNOSIS — L814 Other melanin hyperpigmentation: Secondary | ICD-10-CM | POA: Diagnosis not present

## 2022-10-12 DIAGNOSIS — L578 Other skin changes due to chronic exposure to nonionizing radiation: Secondary | ICD-10-CM | POA: Diagnosis not present

## 2022-10-12 DIAGNOSIS — D229 Melanocytic nevi, unspecified: Secondary | ICD-10-CM | POA: Diagnosis not present

## 2022-10-12 DIAGNOSIS — L298 Other pruritus: Secondary | ICD-10-CM | POA: Diagnosis not present

## 2022-10-12 DIAGNOSIS — D1801 Hemangioma of skin and subcutaneous tissue: Secondary | ICD-10-CM | POA: Diagnosis not present

## 2022-10-12 DIAGNOSIS — L821 Other seborrheic keratosis: Secondary | ICD-10-CM | POA: Diagnosis not present

## 2022-10-12 DIAGNOSIS — L304 Erythema intertrigo: Secondary | ICD-10-CM | POA: Diagnosis not present

## 2022-11-23 ENCOUNTER — Encounter: Payer: Self-pay | Admitting: Family Medicine

## 2022-11-23 ENCOUNTER — Other Ambulatory Visit: Payer: Self-pay | Admitting: Family Medicine

## 2022-11-23 DIAGNOSIS — E2839 Other primary ovarian failure: Secondary | ICD-10-CM

## 2022-11-25 ENCOUNTER — Other Ambulatory Visit: Payer: Medicare PPO

## 2022-12-24 DIAGNOSIS — Z8639 Personal history of other endocrine, nutritional and metabolic disease: Secondary | ICD-10-CM | POA: Diagnosis not present

## 2022-12-24 DIAGNOSIS — E785 Hyperlipidemia, unspecified: Secondary | ICD-10-CM | POA: Diagnosis not present

## 2022-12-24 DIAGNOSIS — R739 Hyperglycemia, unspecified: Secondary | ICD-10-CM | POA: Diagnosis not present

## 2022-12-31 ENCOUNTER — Other Ambulatory Visit: Payer: Self-pay | Admitting: Family Medicine

## 2022-12-31 DIAGNOSIS — Z0000 Encounter for general adult medical examination without abnormal findings: Secondary | ICD-10-CM

## 2022-12-31 DIAGNOSIS — M8000XD Age-related osteoporosis with current pathological fracture, unspecified site, subsequent encounter for fracture with routine healing: Secondary | ICD-10-CM | POA: Diagnosis not present

## 2022-12-31 DIAGNOSIS — H6121 Impacted cerumen, right ear: Secondary | ICD-10-CM | POA: Diagnosis not present

## 2022-12-31 DIAGNOSIS — H259 Unspecified age-related cataract: Secondary | ICD-10-CM | POA: Diagnosis not present

## 2022-12-31 DIAGNOSIS — M2560 Stiffness of unspecified joint, not elsewhere classified: Secondary | ICD-10-CM | POA: Diagnosis not present

## 2022-12-31 DIAGNOSIS — E785 Hyperlipidemia, unspecified: Secondary | ICD-10-CM | POA: Diagnosis not present

## 2022-12-31 DIAGNOSIS — Z1331 Encounter for screening for depression: Secondary | ICD-10-CM | POA: Diagnosis not present

## 2022-12-31 DIAGNOSIS — F5101 Primary insomnia: Secondary | ICD-10-CM | POA: Diagnosis not present

## 2022-12-31 DIAGNOSIS — K30 Functional dyspepsia: Secondary | ICD-10-CM | POA: Diagnosis not present

## 2023-01-07 ENCOUNTER — Encounter: Payer: Self-pay | Admitting: Physician Assistant

## 2023-01-26 DIAGNOSIS — H2513 Age-related nuclear cataract, bilateral: Secondary | ICD-10-CM | POA: Diagnosis not present

## 2023-01-26 DIAGNOSIS — H53002 Unspecified amblyopia, left eye: Secondary | ICD-10-CM | POA: Diagnosis not present

## 2023-01-26 DIAGNOSIS — H353131 Nonexudative age-related macular degeneration, bilateral, early dry stage: Secondary | ICD-10-CM | POA: Diagnosis not present

## 2023-01-26 DIAGNOSIS — H3581 Retinal edema: Secondary | ICD-10-CM | POA: Diagnosis not present

## 2023-01-29 DIAGNOSIS — Z23 Encounter for immunization: Secondary | ICD-10-CM | POA: Diagnosis not present

## 2023-01-29 DIAGNOSIS — Z1211 Encounter for screening for malignant neoplasm of colon: Secondary | ICD-10-CM | POA: Diagnosis not present

## 2023-02-03 ENCOUNTER — Ambulatory Visit: Payer: Medicare PPO

## 2023-02-12 DIAGNOSIS — H903 Sensorineural hearing loss, bilateral: Secondary | ICD-10-CM | POA: Diagnosis not present

## 2023-02-19 ENCOUNTER — Ambulatory Visit: Payer: Medicare PPO | Admitting: Physician Assistant

## 2023-02-19 ENCOUNTER — Ambulatory Visit: Payer: Medicare PPO

## 2023-02-19 ENCOUNTER — Encounter: Payer: Self-pay | Admitting: Physician Assistant

## 2023-02-19 VITALS — BP 91/53 | Temp 97.0°F | Resp 18 | Wt 152.0 lb

## 2023-02-19 DIAGNOSIS — R413 Other amnesia: Secondary | ICD-10-CM | POA: Diagnosis not present

## 2023-02-19 NOTE — Patient Instructions (Signed)
 It was a pleasure to see you today at our office.   Recommendations:  Neurocognitive evaluation at our office   MRI of the brain, the radiology office will call you to arrange you appointment   Check labs today   Follow up pending on the results    For psychiatric meds, mood meds: Please have your primary care physician manage these medications.  If you have any severe symptoms of a stroke, or other severe issues such as confusion,severe chills or fever, etc call 911 or go to the ER as you may need to be evaluated further   For guidance regarding [facility] and if placement were needed at the facility, contact Social Worker tel: [phone]  For assessment of decision of mental capacity and competency:  Call Dr. Rosaline Nine, geriatric psychiatrist at [phone]  Counseling regarding caregiver distress, including caregiver depression, anxiety and issues regarding community resources, adult day care programs, adult living facilities, or memory care questions:  please contact your  Primary Doctor's Social Worker   Whom to call: Memory  decline, memory medications: Call our office [phone]    [email or web address]   RECOMMENDATIONS FOR ALL PATIENTS WITH MEMORY PROBLEMS: 1. Continue to exercise (Recommend 30 minutes of walking everyday, or 3 hours every week) 2. Increase social interactions - continue going to [place] and enjoy social gatherings with friends and family 3. Eat healthy, avoid fried foods and eat more fruits and vegetables 4. Maintain adequate blood pressure, blood sugar, and blood cholesterol level. Reducing the risk of stroke and cardiovascular disease also helps promoting better memory. 5. Avoid stressful situations. Live a simple life and avoid aggravations. Organize your time and prepare for the next day in anticipation. 6. Sleep well, avoid any interruptions of sleep and avoid any  distractions in the bedroom that may interfere with adequate sleep quality 7. Avoid sugar, avoid sweets as there is a strong link between excessive sugar intake, diabetes, and cognitive impairment We discussed the Mediterranean diet, which has been shown to help patients reduce the risk of progressive memory disorders and reduces cardiovascular risk. This includes eating fish, eat fruits and green leafy vegetables, nuts like almonds and hazelnuts, walnuts, and also use olive oil. Avoid fast foods and fried foods as much as possible. Avoid sweets and sugar as sugar use has been linked to worsening of memory function.  There is always a concern of gradual progression of memory problems. If this is the case, then we may need to adjust level of care according to patient needs. Support, both to the patient and caregiver, should then be put into place.      You have been referred for a neuropsychological evaluation (i.e., evaluation of memory and thinking abilities). Please bring someone with you to this appointment if possible, as it is helpful for the doctor to hear from both you and another adult who knows you well. Please bring eyeglasses and hearing aids if you wear them.    The evaluation will take approximately 3 hours and has two parts:   The first part is a clinical interview with the neuropsychologist (Dr. Richie or Dr. Jackquline). During the interview, the neuropsychologist will speak with you and the individual you brought to the appointment.    The second part of the evaluation is testing with the doctor's technician Neal or Luke). During the testing, the technician will ask you to remember different types of material, solve problems, and answer some questionnaires. Your family member will not be  present for this portion of the evaluation.   Please note: We must reserve several hours of the neuropsychologist's time and the psychometrician's time for your evaluation appointment. As such, there is  a No-Show fee of $100. If you are unable to attend any of your appointments, please contact our office as soon as possible to reschedule.      DRIVING: Regarding driving, in patients with progressive memory problems, driving will be impaired. We advise to have someone else do the driving if trouble finding directions or if minor accidents are reported. Independent driving assessment is available to determine safety of driving.   If you are interested in the driving assessment, you can contact the following:  [facility] in [place] [phone]  [facility] [phone]  [facility] [phone]  [facility] [phone] or [phone]   FALL PRECAUTIONS: Be cautious when walking. Scan the area for obstacles that may increase the risk of trips and falls. When getting up in the mornings, sit up at the edge of the bed for a few minutes before getting out of bed. Consider elevating the bed at the head end to avoid drop of blood pressure when getting up. Walk always in a well-lit room (use night lights in the walls). Avoid area rugs or power cords from appliances in the middle of the walkways. Use a walker or a cane if necessary and consider physical therapy for balance exercise. Get your eyesight checked regularly.  FINANCIAL OVERSIGHT: Supervision, especially oversight when making financial decisions or transactions is also recommended.  HOME SAFETY: Consider the safety of the kitchen when operating appliances like stoves, microwave oven, and blender. Consider having supervision and share cooking responsibilities until no longer able to participate in those. Accidents with firearms and other hazards in the house should be identified and addressed as well.   ABILITY TO BE LEFT ALONE: If patient is unable to contact 911 operator, consider using LifeLine, or when the need is there, arrange for someone to stay with patients. Smoking is a fire  hazard, consider supervision or cessation. Risk of wandering should be assessed by caregiver and if detected at any point, supervision and safe proof recommendations should be instituted.  MEDICATION SUPERVISION: Inability to self-administer medication needs to be constantly addressed. Implement a mechanism to ensure safe administration of the medications.      Mediterranean Diet A Mediterranean diet refers to food and lifestyle choices that are based on the traditions of countries located on the [place]. This way of eating has been shown to help prevent certain conditions and improve outcomes for people who have chronic diseases, like kidney disease and heart disease. What are tips for following this plan? Lifestyle  Cook and eat meals together with your family, when possible. Drink enough fluid to keep your urine clear or pale yellow. Be physically active every day. This includes: Aerobic exercise like running or swimming. Leisure activities like gardening, walking, or housework. Get 7-8 hours of sleep each night. If recommended by your health care provider, drink red wine in moderation. This means 1 glass a day for nonpregnant women and 2 glasses a day for men. A glass of wine equals 5 oz (150 mL). Reading food labels  Check the serving size of packaged foods. For foods such as rice and pasta, the serving size refers to the amount of cooked product, not dry. Check the total fat in packaged foods. Avoid foods that have saturated fat or trans fats. Check the ingredients  list for added sugars, such as corn syrup. Shopping  At the grocery store, buy most of your food from the areas near the walls of the store. This includes: Fresh fruits and vegetables (produce). Grains, beans, nuts, and seeds. Some of these may be available in unpackaged forms or large amounts (in bulk). Fresh seafood. Poultry and eggs. Low-fat dairy products. Buy whole ingredients instead of prepackaged  foods. Buy fresh fruits and vegetables in-season from local farmers markets. Buy frozen fruits and vegetables in resealable bags. If you do not have access to quality fresh seafood, buy precooked frozen shrimp or canned fish, such as tuna, salmon, or sardines. Buy small amounts of raw or cooked vegetables, salads, or olives from the deli or salad bar at your store. Stock your pantry so you always have certain foods on hand, such as olive oil, canned tuna, canned tomatoes, rice, pasta, and beans. Cooking  Cook foods with extra-virgin olive oil instead of using butter or other vegetable oils. Have meat as a side dish, and have vegetables or grains as your main dish. This means having meat in small portions or adding small amounts of meat to foods like pasta or stew. Use beans or vegetables instead of meat in common dishes like chili or lasagna. Experiment with different cooking methods. Try roasting or broiling vegetables instead of steaming or sauteing them. Add frozen vegetables to soups, stews, pasta, or rice. Add nuts or seeds for added healthy fat at each meal. You can add these to yogurt, salads, or vegetable dishes. Marinate fish or vegetables using olive oil, lemon juice, garlic, and fresh herbs. Meal planning  Plan to eat 1 vegetarian meal one day each week. Try to work up to 2 vegetarian meals, if possible. Eat seafood 2 or more times a week. Have healthy snacks readily available, such as: Vegetable sticks with hummus. Greek yogurt. Fruit and nut trail mix. Eat balanced meals throughout the week. This includes: Fruit: 2-3 servings a day Vegetables: 4-5 servings a day Low-fat dairy: 2 servings a day Fish, poultry, or lean meat: 1 serving a day Beans and legumes: 2 or more servings a week Nuts and seeds: 1-2 servings a day Whole grains: 6-8 servings a day Extra-virgin olive oil: 3-4 servings a day Limit red meat and sweets to only a few servings a month What are my food  choices? Mediterranean diet Recommended Grains: Whole-grain pasta. Brown rice. Bulgar wheat. Polenta. Couscous. Whole-wheat bread. [place] Made[place]. Vegetables: Artichokes. Beets. Broccoli. Cabbage. Carrots. Eggplant. Green beans. Chard. Kale. Spinach. Onions. Leeks. Peas. Squash. Tomatoes. Peppers. Radishes. Fruits: Apples. Apricots. Avocado. Berries. Bananas. Cherries. Dates. Figs. Grapes. Lemons. Melon. Oranges. Peaches. Plums. Pomegranate. Meats and other protein foods: Beans. Almonds. Sunflower seeds. Pine nuts. Peanuts. Cod. Salmon. Scallops. Shrimp. Tuna. Tilapia. Clams. Oysters. Eggs. Dairy: Low-fat milk. Cheese. Greek yogurt. Beverages: Water. Red wine. Herbal tea. Fats and oils: Extra virgin olive oil. Avocado oil. Grape seed oil. Sweets and desserts: Greek yogurt with honey. Baked apples. Poached pears. Trail mix. Seasoning and other foods: Basil. Cilantro. Coriander. Cumin. Mint. Parsley. Sage. Rosemary. Tarragon. Garlic. Oregano. Thyme. Pepper. Balsalmic vinegar. Tahini. Hummus. Tomato sauce. Olives. Mushrooms. Limit these Grains: Prepackaged pasta or rice dishes. Prepackaged cereal with added sugar. Vegetables: Deep fried potatoes (french fries). Fruits: Fruit canned in syrup. Meats and other protein foods: Beef. Pork. Lamb. Poultry with skin. Hot dogs. [place]. Dairy: Ice cream. Sour cream. Whole milk. Beverages: Juice. Sugar-sweetened soft drinks. Beer. Liquor and spirits. Fats and oils: Butter. Canola oil.  Vegetable oil. Beef fat (tallow). Lard. Sweets and desserts: Cookies. Cakes. Pies. Candy. Seasoning and other foods: Mayonnaise. Premade sauces and marinades. The items listed may not be a complete list. Talk with your dietitian about what dietary choices are right for you. Summary The Mediterranean diet includes both food and lifestyle choices. Eat a variety of fresh fruits and vegetables, beans, nuts, seeds, and whole grains. Limit the amount of red meat and sweets that  you eat. Talk with your health care provider about whether it is safe for you to drink red wine in moderation. This means 1 glass a day for nonpregnant women and 2 glasses a day for men. A glass of wine equals 5 oz (150 mL). This information is not intended to replace advice given to you by your health care provider. Make sure you discuss any questions you have with your health care provider. Document Released: 08/22/2015 Document Revised: 09/24/2015 Document Reviewed: 08/22/2015 Elsevier Interactive Patient Education  2017 [employer].

## 2023-02-19 NOTE — Progress Notes (Signed)
 Assessment/Plan:     Kara [name] is a very pleasant 78 y.o. year old RH female with a history of hypertension, hyperlipidemia, OSA not on CPAP, macular degeneration, legally blind L,  insomnia, arthritis seen today for evaluation of memory loss. MoCA today is 29/30 .  Etiology unclear, very low suspicion for neurodegenerative disease. Workup in progress.     Memory Concerns   MRI brain without contrast to assess for underlying structural abnormality and assess vascular load  Neurocognitive testing to further evaluate cognitive concerns and determine other underlying cause of memory changes, including potential contribution from sleep, anxiety, attention, or depression among others  Check B12, TSH NO indication for antidementia medication at this time Recommend good control of cardiovascular risk factors.   Continue to control mood as per PCP Check hearing to improve comprehension Folllow up pending on Neuropsych evaluation  Recommend psychotherapy/CBT for situational anxiety/depression  Subjective:    The patient is here alone    How long did patient have memory difficulties?  Aware of it for a long time, at least 30 years when I was changing jobs and moving and was calling someone the wrong name; did a memory test, and it was related to stress. Kept track of the changes, a journal for the last 10-15 years. Have been having more issues with calculation.  Able to remember new information, recent conversations, names. She reports a history of hearing loss which may be contributing to these changes.  repeats oneself?  Endorsed Disoriented when walking into a room?  Patient denies.     Leaving objects in unusual places?  Misplaced the keys and kid of scary. Forgot the wallet today.   Wandering behavior? denies   Any personality changes, or depression, anxiety?  Since the election I have ben depressed, world events provoke depression.  Hallucinations or paranoia? denies    Seizures? denies    Any sleep changes? Does not sleep well terrible sleeper since menopause.  Work sudoku till falling asleep again. Denies vivid dreams, REM behavior or sleepwalking   Sleep apnea?  Endorsed, not on CPAP.  Any hygiene concerns?  Denies.   Independent of bathing and dressing? Endorsed  Does the patient need help with medications?  Patient is in charge, may miss a dose if she goes out   Who is in charge of the finances? Patient is in charge. Keeps a chart, some bills are on autopay     Any changes in appetite?   Denies.     Patient have trouble swallowing?  Denies.   Does the patient cook? Not much.   Any headaches? 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 Has knee arthritis, takes tylenol .    Ambulates with difficulty? Uses a cane for stability, especially with uneven grounds  Recent falls or head injuries? Denies.     Vision changes?  L legally blind, a lazy eye stopped working Any strokelike symptoms? Denies.   Any tremors?  Due to hyperthyroidism, once well managed with meds, has resolved   Any anosmia? Denies.   Any incontinence of urine?  Denies  Any bowel dysfunction? Denies.      Patient lives alone in a retirement community History of heavy alcohol intake? Denies.   History of heavy tobacco use? Denies.   Family history of dementia?  Father had dementia.Old age.  Does patient drive? No but sense of direction has changed.   Retired publishing copy, worked for [employer].  Masters Degree.   No Known Allergies  Current Outpatient Medications  Medication Instructions   CALCIUM PO 1 tablet, Daily   cholecalciferol (VITAMIN D) 2,000 Units, Daily   COD LIVER OIL PO 1 capsule, Daily   diclofenac  sodium (VOLTAREN ) 2 g, Topical, 4 times daily PRN   ezetimibe (ZETIA) 10 mg, Every morning   famotidine (PEPCID) 40 MG tablet No dose, route, or frequency recorded.   ibuprofen (ADVIL) 400 mg, Every 6 hours PRN   meloxicam  (MOBIC) 15 mg, Daily   Multiple Vitamins-Minerals (PRESERVISION/LUTEIN) CAPS 1 capsule, Daily   TURMERIC PO 1 tablet, Daily     VITALS:   Vitals:   02/19/23 1325  BP: (!) 91/53  Resp: 18  Temp: (!) 97 F (36.1 C)  SpO2: 98%  Weight: 152 lb (68.9 kg)      PHYSICAL EXAM   HEENT:  Normocephalic, atraumatic.  The superficial temporal arteries are without ropiness or tenderness. Cardiovascular: Regular rate and rhythm. Lungs: Clear to auscultation bilaterally. Neck: There are no carotid bruits noted bilaterally.  NEUROLOGICAL:    02/19/2023    2:00 PM  Montreal Cognitive Assessment   Visuospatial/ Executive (0/5) 5  Naming (0/3) 3  Attention: Read list of digits (0/2) 2  Attention: Read list of letters (0/1) 1  Attention: Serial 7 subtraction starting at 100 (0/3) 3  Language: Repeat phrase (0/2) 2  Language : Fluency (0/1) 1  Abstraction (0/2) 1  Delayed Recall (0/5) 5  Orientation (0/6) 6  Total 29  Adjusted Score (based on education) 29        No data to display           Orientation:  Alert and oriented to person, place and  time . No aphasia or dysarthria. Fund of knowledge is appropriate. Recent and remote memory normal Attention and concentration are normal.  Able to name objects and repeat phrases Delayed recall  5/5 Cranial nerves: There is good facial symmetry.  Legally blind  L  Speech is fluent and clear. No tongue deviation. Hearing is intact to conversational tone.  Tone: Tone is good throughout. Sensation: Sensation is intact to light touch.  Vibration is intact at the bilateral big toe.  Coordination: The patient has no difficulty with RAM's or FNF bilaterally. Normal finger to nose  Motor: Strength is 5/5 in the bilateral upper and lower extremities. There is no pronator drift. There are no fasciculations noted. DTR's: Deep tendon reflexes are 2/4 bilaterally. Gait and Station: The patient is able to ambulate without difficulty. Uses a cane for  stability  Gait is cautious and narrow. Stride length is normal        Thank you for allowing us  the opportunity to participate in the care of this nice patient. Please do not hesitate to contact us  for any questions or concerns.   Total time spent on today's visit was 45 minutes dedicated to this patient today, preparing to see patient, examining the patient, ordering tests and/or medications and counseling the patient, documenting clinical information in the EHR or other health record, independently interpreting results and communicating results to the patient/family, discussing treatment and goals, answering patient's questions and coordinating care.  Cc:  Patient, No Pcp Per  Camie Sevin 02/19/2023 2:22 PM

## 2023-02-24 ENCOUNTER — Ambulatory Visit: Payer: Medicare PPO

## 2023-03-05 ENCOUNTER — Other Ambulatory Visit: Payer: Self-pay

## 2023-03-05 DIAGNOSIS — I872 Venous insufficiency (chronic) (peripheral): Secondary | ICD-10-CM

## 2023-03-12 ENCOUNTER — Ambulatory Visit: Payer: Medicare PPO | Admitting: Physician Assistant

## 2023-03-12 ENCOUNTER — Ambulatory Visit (HOSPITAL_COMMUNITY)
Admission: RE | Admit: 2023-03-12 | Discharge: 2023-03-12 | Disposition: A | Discharge status: Home / Self Care | Payer: Medicare PPO | Source: Ambulatory Visit | Attending: Vascular Surgery | Admitting: Vascular Surgery

## 2023-03-12 VITALS — BP 110/67 | HR 86 | Temp 98.4°F | Resp 18 | Ht 61.0 in | Wt 150.0 lb

## 2023-03-12 DIAGNOSIS — I872 Venous insufficiency (chronic) (peripheral): Secondary | ICD-10-CM

## 2023-03-12 DIAGNOSIS — M79661 Pain in right lower leg: Secondary | ICD-10-CM | POA: Diagnosis not present
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
## 2023-03-26 ENCOUNTER — Ambulatory Visit: Payer: Medicare PPO

## 2023-03-31 ENCOUNTER — Other Ambulatory Visit: Payer: Medicare PPO

## 2023-04-20 ENCOUNTER — Ambulatory Visit: Payer: Self-pay | Admitting: Psychology

## 2023-04-20 ENCOUNTER — Ambulatory Visit: Admitting: Psychology

## 2023-04-20 DIAGNOSIS — R4189 Other symptoms and signs involving cognitive functions and awareness: Secondary | ICD-10-CM

## 2023-04-20 DIAGNOSIS — R419 Unspecified symptoms and signs involving cognitive functions and awareness: Secondary | ICD-10-CM | POA: Diagnosis not present

## 2023-04-20 NOTE — Progress Notes (Signed)
   Psychometrician Note   Cognitive testing was administered to Kara [name] by Wallace Keller, B.S. (psychometrist) under the supervision of Dr. Annice Pih, Psy.D., licensed psychologist on 04/20/2023. Kara [name] did not appear overtly distressed by the testing session per behavioral observation or responses across self-report questionnaires. Rest breaks were offered.    The battery of tests administered was selected by Dr. Annice Pih, Psy.D. with consideration to Kara [name]'s current level of functioning, the nature of her symptoms, emotional and behavioral responses during interview, level of literacy, observed level of motivation/effort, and the nature of the referral question. This battery was communicated to the psychometrist. Communication between Dr. Annice Pih, Psy.D. and the psychometrist was ongoing throughout the evaluation and Dr. Annice Pih, Psy.D. was immediately accessible at all times. Dr. Annice Pih, Psy.D. provided supervision to the psychometrist on the date of this service to the extent necessary to assure the quality of all services provided.    Kara [name] will return within approximately 1-2 weeks for an interactive feedback session with Dr. Robbie Lis at which time her test performances, clinical impressions, and treatment recommendations will be reviewed in detail. Kara [name] understands she can contact our office should she require our assistance before this time.  A total of 105 minutes of billable time were spent face-to-face with Kara [name] by the psychometrist. This includes both test administration and scoring time. Billing for these services is reflected in the clinical report generated by Dr. Annice Pih, Psy.D.  This note reflects time spent with the psychometrician and does not include test scores or any clinical interpretations made by Dr. Robbie Lis. The full report will follow in a separate note.
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
## 2023-04-27 ENCOUNTER — Ambulatory Visit: Admitting: Psychology

## 2023-04-27 ENCOUNTER — Encounter: Admitting: Psychology

## 2023-04-27 DIAGNOSIS — R419 Unspecified symptoms and signs involving cognitive functions and awareness: Secondary | ICD-10-CM | POA: Diagnosis not present

## 2023-04-27 NOTE — Progress Notes (Signed)
   NEUROPSYCHOLOGY FEEDBACK SESSION [place]  [place] Department of Neurology  Date of Feedback Session: 04/27/2023  REASON FOR REFERRAL   Kara [name] is a 78 year old, right-handed, White female with 18 years of formal education. She was referred for neuropsychological evaluation by Kara Filbert, PA-C, to assess current neurocognitive functioning, document potential cognitive deficits, and assist with treatment planning. This is her first neuropsychological evaluation.  FEEDBACK   Patient completed a comprehensive neuropsychological evaluation on 04/20/2023. Please refer to that encounter for the full report and recommendations. Briefly, results indicated normal, if not superior, cognitive functioning, with no impairments observed in any domain. She does not show evidence of a neurocognitive disorder at this time. Her overall profile reflects healthy cognitive aging and well-preserved cognitive and functional abilities. While neuroimaging has not yet been completed, her exceptional cognitive profile makes the presence of significant intracranial abnormalities unlikely. Subjective cognitive complaints are most likely related to normal aging and personal factors, such as reduced sensory function, sleep disturbance, and chronic pain. To the extent that modifiable factors can be ameliorated, she may find that her subjective cognitive concerns improve. Results further serve as a baseline for future comparison, should it ever become necessary to re-evaluate the patient.   Patient participated in today's telephone session unaccompanied from her residence, while I was located in the office. We reviewed the limitations of telemedicine for evaluation and management, as well as the option for in-person appointments. She expressed understanding and agreed to proceed. The session focused on discussing the results of her neuropsychological evaluation. She was given the opportunity to ask  questions, which were addressed, and encouraged to reach out with any future concerns. A copy of the report was mailed at the conclusion of the visit.  DISPOSITION   Patient will follow up with the referring provider, Kara [name]. No follow-up neuropsychological testing was scheduled at this time. Please feel free to refer the patient for repeated evaluation if she shows a significant change in neurocognitive status.  SERVICE   This feedback session was conducted by Janice Meeter, Psy.D. One unit of [id number] was billed for Dr. Donavon Fudge' time spent in preparing, conducting, and documenting the current feedback session.  This report was generated using voice recognition software. While this document has been carefully reviewed, transcription errors may be present. I apologize in advance for any inconvenience. Please contact me if further clarification is needed.

## 2023-05-06 ENCOUNTER — Encounter: Payer: Self-pay | Admitting: Radiology

## 2023-05-21 ENCOUNTER — Ambulatory Visit
Admission: RE | Admit: 2023-05-21 | Discharge: 2023-05-21 | Disposition: A | Discharge status: Home / Self Care | Source: Ambulatory Visit | Attending: Family Medicine | Admitting: Family Medicine

## 2023-05-21 DIAGNOSIS — Z0000 Encounter for general adult medical examination without abnormal findings: Secondary | ICD-10-CM

## 2023-05-21 DIAGNOSIS — Z1231 Encounter for screening mammogram for malignant neoplasm of breast: Secondary | ICD-10-CM | POA: Diagnosis not present

## 2023-06-18 ENCOUNTER — Other Ambulatory Visit

## 2023-06-21 ENCOUNTER — Telehealth: Payer: Self-pay

## 2023-06-21 NOTE — Telephone Encounter (Signed)
 MRI brain wo contrast was scheduled at [facility] 06/18/2023. Patient cancelled appointment.FYI.

## 2023-06-21 NOTE — Telephone Encounter (Signed)
 Close encounter

## 2023-06-23 DIAGNOSIS — H53032 Strabismic amblyopia, left eye: Secondary | ICD-10-CM | POA: Diagnosis not present

## 2023-06-23 DIAGNOSIS — H02831 Dermatochalasis of right upper eyelid: Secondary | ICD-10-CM | POA: Diagnosis not present

## 2023-06-23 DIAGNOSIS — H02832 Dermatochalasis of right lower eyelid: Secondary | ICD-10-CM | POA: Diagnosis not present

## 2023-06-23 DIAGNOSIS — H02834 Dermatochalasis of left upper eyelid: Secondary | ICD-10-CM | POA: Diagnosis not present

## 2023-06-23 DIAGNOSIS — H25813 Combined forms of age-related cataract, bilateral: Secondary | ICD-10-CM | POA: Diagnosis not present

## 2023-06-23 DIAGNOSIS — H40033 Anatomical narrow angle, bilateral: Secondary | ICD-10-CM | POA: Diagnosis not present

## 2023-06-23 DIAGNOSIS — H353131 Nonexudative age-related macular degeneration, bilateral, early dry stage: Secondary | ICD-10-CM | POA: Diagnosis not present

## 2023-06-23 DIAGNOSIS — H02835 Dermatochalasis of left lower eyelid: Secondary | ICD-10-CM | POA: Diagnosis not present

## 2023-07-07 ENCOUNTER — Other Ambulatory Visit: Payer: Medicare PPO

## 2023-07-27 ENCOUNTER — Other Ambulatory Visit (HOSPITAL_BASED_OUTPATIENT_CLINIC_OR_DEPARTMENT_OTHER)

## 2023-09-06 DIAGNOSIS — H25812 Combined forms of age-related cataract, left eye: Secondary | ICD-10-CM | POA: Diagnosis not present

## 2023-09-07 DIAGNOSIS — H25812 Combined forms of age-related cataract, left eye: Secondary | ICD-10-CM | POA: Diagnosis not present

## 2023-09-08 DIAGNOSIS — H53032 Strabismic amblyopia, left eye: Secondary | ICD-10-CM | POA: Diagnosis not present

## 2023-09-08 DIAGNOSIS — H02832 Dermatochalasis of right lower eyelid: Secondary | ICD-10-CM | POA: Diagnosis not present

## 2023-09-08 DIAGNOSIS — H02831 Dermatochalasis of right upper eyelid: Secondary | ICD-10-CM | POA: Diagnosis not present

## 2023-09-08 DIAGNOSIS — H40033 Anatomical narrow angle, bilateral: Secondary | ICD-10-CM | POA: Diagnosis not present

## 2023-09-08 DIAGNOSIS — Z961 Presence of intraocular lens: Secondary | ICD-10-CM | POA: Diagnosis not present

## 2023-09-08 DIAGNOSIS — H02835 Dermatochalasis of left lower eyelid: Secondary | ICD-10-CM | POA: Diagnosis not present

## 2023-09-08 DIAGNOSIS — H25811 Combined forms of age-related cataract, right eye: Secondary | ICD-10-CM | POA: Diagnosis not present

## 2023-09-08 DIAGNOSIS — H353131 Nonexudative age-related macular degeneration, bilateral, early dry stage: Secondary | ICD-10-CM | POA: Diagnosis not present

## 2023-09-08 DIAGNOSIS — H02834 Dermatochalasis of left upper eyelid: Secondary | ICD-10-CM | POA: Diagnosis not present

## 2023-09-22 DIAGNOSIS — H53032 Strabismic amblyopia, left eye: Secondary | ICD-10-CM | POA: Diagnosis not present

## 2023-09-22 DIAGNOSIS — H02831 Dermatochalasis of right upper eyelid: Secondary | ICD-10-CM | POA: Diagnosis not present

## 2023-09-22 DIAGNOSIS — H25811 Combined forms of age-related cataract, right eye: Secondary | ICD-10-CM | POA: Diagnosis not present

## 2023-09-22 DIAGNOSIS — H353131 Nonexudative age-related macular degeneration, bilateral, early dry stage: Secondary | ICD-10-CM | POA: Diagnosis not present

## 2023-09-22 DIAGNOSIS — Z961 Presence of intraocular lens: Secondary | ICD-10-CM | POA: Diagnosis not present

## 2023-09-22 DIAGNOSIS — H02832 Dermatochalasis of right lower eyelid: Secondary | ICD-10-CM | POA: Diagnosis not present

## 2023-09-22 DIAGNOSIS — H40033 Anatomical narrow angle, bilateral: Secondary | ICD-10-CM | POA: Diagnosis not present

## 2023-09-22 DIAGNOSIS — H02835 Dermatochalasis of left lower eyelid: Secondary | ICD-10-CM | POA: Diagnosis not present

## 2023-09-22 DIAGNOSIS — H02834 Dermatochalasis of left upper eyelid: Secondary | ICD-10-CM | POA: Diagnosis not present

## 2023-10-21 ENCOUNTER — Institutional Professional Consult (permissible substitution): Payer: Medicare PPO | Admitting: Psychology

## 2023-10-21 ENCOUNTER — Ambulatory Visit: Payer: Self-pay

## 2023-11-04 ENCOUNTER — Encounter: Payer: Medicare PPO | Admitting: Psychology

## 2023-12-01 DIAGNOSIS — Z9842 Cataract extraction status, left eye: Secondary | ICD-10-CM | POA: Diagnosis not present

## 2023-12-01 DIAGNOSIS — H25811 Combined forms of age-related cataract, right eye: Secondary | ICD-10-CM | POA: Diagnosis not present

## 2023-12-01 DIAGNOSIS — Z961 Presence of intraocular lens: Secondary | ICD-10-CM | POA: Diagnosis not present

## 2023-12-01 DIAGNOSIS — H02834 Dermatochalasis of left upper eyelid: Secondary | ICD-10-CM | POA: Diagnosis not present

## 2023-12-01 DIAGNOSIS — H40033 Anatomical narrow angle, bilateral: Secondary | ICD-10-CM | POA: Diagnosis not present

## 2023-12-01 DIAGNOSIS — H53032 Strabismic amblyopia, left eye: Secondary | ICD-10-CM | POA: Diagnosis not present

## 2023-12-01 DIAGNOSIS — H02832 Dermatochalasis of right lower eyelid: Secondary | ICD-10-CM | POA: Diagnosis not present

## 2023-12-01 DIAGNOSIS — H353131 Nonexudative age-related macular degeneration, bilateral, early dry stage: Secondary | ICD-10-CM | POA: Diagnosis not present

## 2023-12-01 DIAGNOSIS — H02831 Dermatochalasis of right upper eyelid: Secondary | ICD-10-CM | POA: Diagnosis not present

## 2024-01-18 DIAGNOSIS — N6452 Nipple discharge: Secondary | ICD-10-CM

## 2024-01-25 ENCOUNTER — Ambulatory Visit
Admission: RE | Admit: 2024-01-25 | Discharge: 2024-01-25 | Disposition: A | Source: Ambulatory Visit | Attending: Family Medicine | Admitting: Family Medicine

## 2024-01-25 DIAGNOSIS — N6452 Nipple discharge: Secondary | ICD-10-CM

## 2024-02-02 ENCOUNTER — Other Ambulatory Visit: Payer: Self-pay | Admitting: Family Medicine

## 2024-02-02 DIAGNOSIS — Z853 Personal history of malignant neoplasm of breast: Secondary | ICD-10-CM

## 2024-02-02 DIAGNOSIS — N6452 Nipple discharge: Secondary | ICD-10-CM

## 2024-02-08 ENCOUNTER — Other Ambulatory Visit (HOSPITAL_BASED_OUTPATIENT_CLINIC_OR_DEPARTMENT_OTHER)

## 2024-03-04 ENCOUNTER — Other Ambulatory Visit

## 2024-03-27 ENCOUNTER — Other Ambulatory Visit

## 2024-09-27 ENCOUNTER — Other Ambulatory Visit (HOSPITAL_BASED_OUTPATIENT_CLINIC_OR_DEPARTMENT_OTHER)
